# Patient Record
Sex: Male | Born: 1965 | Race: Black or African American | Hispanic: No | Marital: Married | Smoking: Never smoker
Health system: Southern US, Community
[De-identification: ages and names within clinical notes are randomized; demographics above are authoritative.]

## PROBLEM LIST (undated history)

## (undated) DIAGNOSIS — N200 Calculus of kidney: Secondary | ICD-10-CM

## (undated) DIAGNOSIS — R7303 Prediabetes: Secondary | ICD-10-CM

## (undated) HISTORY — PX: KNEE SURGERY: SHX244

## (undated) HISTORY — PX: APPENDECTOMY: SHX54

## (undated) HISTORY — PX: CHOLECYSTECTOMY: SHX55

---

## 2001-06-08 ENCOUNTER — Encounter: Admission: RE | Admit: 2001-06-08 | Discharge: 2001-06-08 | Payer: Self-pay | Admitting: Urology

## 2001-06-08 ENCOUNTER — Encounter: Payer: Self-pay | Admitting: Urology

## 2001-06-11 ENCOUNTER — Ambulatory Visit (HOSPITAL_BASED_OUTPATIENT_CLINIC_OR_DEPARTMENT_OTHER): Admission: RE | Admit: 2001-06-11 | Discharge: 2001-06-11 | Payer: Self-pay | Admitting: Urology

## 2007-08-30 ENCOUNTER — Emergency Department (HOSPITAL_COMMUNITY): Admission: EM | Admit: 2007-08-30 | Discharge: 2007-08-30 | Payer: Self-pay | Admitting: Family Medicine

## 2010-11-12 NOTE — Op Note (Signed)
[facility]  Patient:    Devon Knight, Devon Knight Visit Number: [id number] MRN: [id number]          Service Type: NES Location: [facility] Attending Physician:  Laqueta Jean Proc. Date: 06/11/01 Admit Date:  06/11/2001                             Operative Report  PREOPERATIVE DIAGNOSIS:  Left lower ureteral calculus.  POSTOPERATIVE DIAGNOSIS:  Left lower ureteral calculus.  PROCEDURE PERFORMED:  Cystourethroscopy, left retrograde pyelogram, balloon dilation of left lower ureter, ureteroscopy, basket extraction of left ureteral calculus, retrograde pyelogram with interpretation, and left JJ catheter.  PREPARATION:  After appropriate preanesthesia, the patient is brought to the operating room and placed on the operating table in the dorsosupine position where general LMA anesthesia was introduced.  He was then replaced in the low Allen stirrups, dorsolithotomy position where the pubis was prepped with Betadine solution and draped in the usual fashion.  DESCRIPTION OF PROCEDURE:  Cystoscopy revealed a normal appearing bladder.  A left retrograde pyelogram was performed which showed a very small left lower ureter, but no definite stone could be identified.  A  previously found calcification proved to be a phlebolith lateral to the left lower ureter.  The 6 French ureteroscope could not be passed into the ureter, therefore, a 4 cm balloon dilator was placed in the lower ureter and the balloon dilated for three minutes.  Following balloon dilation, ureteroscopy then was easy and the ureter was noted to be quite reactive above the level of the balloon dilation. Ureteroscopy was continued into the mid ureter and a left ureteral calculus was identified and basket extracted measuring approximately 3 mm, consistent with the CT scan observation of left lower ureteral 3 mm stone.  The stone was of some interest because it was relatively clear to light tan in nature  as opposed to black or dark brown.  Repeat ureteroscopy revealed no other stone, retrograde pyelogram revealed no other stone and a normal appearing upper ureter.  It was elected to leave the JJ because of the severe reactive thickness of the midureter and reactivity of the lower ureter.  A 6 French x 26 cc JJ catheter was passed over the wire into the kidney and left curved into the bladder. The patient tolerated the procedure quite well and was given IV Toradol and at the beginning of the case had B&O suppository.  He had Xylocaine in the ureter as well as Xylocaine in the urethral.  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  He was awakened and taken to the recovery room in good condition. Attending Physician:  Laqueta Jean DD:  06/11/01 TD:  06/12/01 Job: [id number]

## 2015-11-23 ENCOUNTER — Encounter (HOSPITAL_COMMUNITY): Payer: Self-pay | Admitting: Emergency Medicine

## 2015-11-23 DIAGNOSIS — N200 Calculus of kidney: Secondary | ICD-10-CM | POA: Diagnosis not present

## 2015-11-23 DIAGNOSIS — R109 Unspecified abdominal pain: Secondary | ICD-10-CM | POA: Diagnosis present

## 2015-11-23 DIAGNOSIS — Z9049 Acquired absence of other specified parts of digestive tract: Secondary | ICD-10-CM | POA: Insufficient documentation

## 2015-11-23 NOTE — ED Notes (Signed)
Pt. reports left flank pain radiating to left groin onset this evening , denies hematuria or dysuria . No fever or chills. Pt. stated history of kidney stones.

## 2015-11-24 ENCOUNTER — Encounter (HOSPITAL_COMMUNITY): Payer: Self-pay | Admitting: Emergency Medicine

## 2015-11-24 ENCOUNTER — Emergency Department (HOSPITAL_COMMUNITY): Payer: BC Managed Care – PPO

## 2015-11-24 ENCOUNTER — Emergency Department (HOSPITAL_COMMUNITY)
Admission: EM | Admit: 2015-11-24 | Discharge: 2015-11-24 | Disposition: A | Discharge status: Home / Self Care | Payer: BC Managed Care – PPO | Attending: Emergency Medicine | Admitting: Emergency Medicine

## 2015-11-24 DIAGNOSIS — R52 Pain, unspecified: Secondary | ICD-10-CM

## 2015-11-24 DIAGNOSIS — N200 Calculus of kidney: Secondary | ICD-10-CM

## 2015-11-24 HISTORY — DX: Calculus of kidney: N20.0

## 2015-11-24 LAB — COMPREHENSIVE METABOLIC PANEL
ALT: 29 U/L (ref 17–63)
ANION GAP: 4 — AB (ref 5–15)
AST: 31 U/L (ref 15–41)
Albumin: 3.9 g/dL (ref 3.5–5.0)
Alkaline Phosphatase: 47 U/L (ref 38–126)
BUN: 20 mg/dL (ref 6–20)
CHLORIDE: 105 mmol/L (ref 101–111)
CO2: 28 mmol/L (ref 22–32)
Calcium: 9 mg/dL (ref 8.9–10.3)
Creatinine, Ser: 1.7 mg/dL — ABNORMAL HIGH (ref 0.61–1.24)
GFR, EST AFRICAN AMERICAN: 52 mL/min — AB (ref >60)
GFR, EST NON AFRICAN AMERICAN: 45 mL/min — AB (ref >60)
Glucose, Bld: 158 mg/dL — ABNORMAL HIGH (ref 65–99)
POTASSIUM: 3.8 mmol/L (ref 3.5–5.1)
Sodium: 137 mmol/L (ref 135–145)
TOTAL PROTEIN: 6.7 g/dL (ref 6.5–8.1)
Total Bilirubin: 0.4 mg/dL (ref 0.3–1.2)

## 2015-11-24 LAB — URINE MICROSCOPIC-ADD ON

## 2015-11-24 LAB — URINALYSIS, ROUTINE W REFLEX MICROSCOPIC
Bilirubin Urine: NEGATIVE
Glucose, UA: NEGATIVE mg/dL
Ketones, ur: NEGATIVE mg/dL
NITRITE: NEGATIVE
PROTEIN: NEGATIVE mg/dL
SPECIFIC GRAVITY, URINE: 1.027 (ref 1.005–1.030)
pH: 6 (ref 5.0–8.0)

## 2015-11-24 LAB — CBC
HEMATOCRIT: 40.4 % (ref 39.0–52.0)
Hemoglobin: 13.4 g/dL (ref 13.0–17.0)
MCH: 30.4 pg (ref 26.0–34.0)
MCHC: 33.2 g/dL (ref 30.0–36.0)
MCV: 91.6 fL (ref 78.0–100.0)
Platelets: 227 10*3/uL (ref 150–400)
RBC: 4.41 MIL/uL (ref 4.22–5.81)
RDW: 12.8 % (ref 11.5–15.5)
WBC: 7.3 10*3/uL (ref 4.0–10.5)

## 2015-11-24 MED ORDER — TAMSULOSIN HCL 0.4 MG PO CAPS: 0.4000 mg | ORAL_CAPSULE | Freq: Every day | ORAL | Status: DC

## 2015-11-24 MED ORDER — ONDANSETRON 8 MG PO TBDP: ORAL_TABLET | ORAL | Status: DC

## 2015-11-24 MED ORDER — MORPHINE SULFATE (PF) 4 MG/ML IV SOLN
4.0000 mg | Freq: Once | INTRAVENOUS | Status: AC
  Administered 2015-11-24: 4 mg via INTRAVENOUS
  Filled 2015-11-24: qty 1

## 2015-11-24 MED ORDER — TAMSULOSIN HCL 0.4 MG PO CAPS
0.4000 mg | ORAL_CAPSULE | Freq: Every day | ORAL | Status: DC
  Administered 2015-11-24: 0.4 mg via ORAL
  Filled 2015-11-24: qty 1

## 2015-11-24 MED ORDER — OXYCODONE-ACETAMINOPHEN 5-325 MG PO TABS: 1.0000 | ORAL_TABLET | Freq: Four times a day (QID) | ORAL | Status: DC | PRN

## 2015-11-24 MED ORDER — ONDANSETRON HCL 4 MG/2ML IJ SOLN
4.0000 mg | Freq: Once | INTRAMUSCULAR | Status: AC
  Administered 2015-11-24: 4 mg via INTRAVENOUS
  Filled 2015-11-24: qty 2

## 2015-11-24 MED ORDER — SODIUM CHLORIDE 0.9 % IV BOLUS (SEPSIS)
1000.0000 mL | Freq: Once | INTRAVENOUS | Status: AC
Start: 2015-11-24 — End: 2015-11-24
  Administered 2015-11-24: 1000 mL via INTRAVENOUS

## 2015-11-24 MED ORDER — IBUPROFEN 800 MG PO TABS: 800.0000 mg | ORAL_TABLET | Freq: Three times a day (TID) | ORAL | Status: DC

## 2015-11-24 MED ORDER — KETOROLAC TROMETHAMINE 30 MG/ML IJ SOLN
30.0000 mg | Freq: Once | INTRAMUSCULAR | Status: AC
  Administered 2015-11-24: 30 mg via INTRAVENOUS
  Filled 2015-11-24: qty 1

## 2015-11-24 NOTE — ED Notes (Signed)
Dr. Palumbo at bedside. 

## 2015-11-24 NOTE — ED Notes (Signed)
Pt given a strainer to take home with him.

## 2015-11-24 NOTE — ED Notes (Signed)
Patient Alert and oriented X4. Stable and ambulatory. Patient verbalized understanding of the discharge instructions.  Patient belongings were taken by the patient.  

## 2015-11-24 NOTE — ED Provider Notes (Signed)
CSN: [id number]     Arrival date & time 11/23/15  2341 History  By signing my name below, I, Bridgette Habermann, attest that this documentation has been prepared under the direction and in the presence of Skylar Flynt, MD. Electronically Signed: Bridgette Habermann, ED Scribe. 11/24/2015. 12:55 AM.   Chief Complaint  Patient presents with  . Flank Pain   Patient is a 50 y.o. male presenting with flank pain. The history is provided by the patient. No language interpreter was used.  Flank Pain This is a new problem. The current episode started 3 to 5 hours ago. The problem occurs constantly. The problem has not changed since onset.Pertinent negatives include no chest pain, no abdominal pain, no headaches and no shortness of breath. Nothing aggravates the symptoms. Nothing relieves the symptoms. He has tried acetaminophen for the symptoms. The treatment provided no relief.   HPI Comments: Devon Knight is a 50 y.o. male with a h/o kidney stones who presents to the Emergency Department complaining of sudden onset, constant, left lower back pain radiating to his midthigh onset 8 pm today. He reports associated nausea and vomiting. Patient was at rest when the pain came on. Patient has tried Aleve to alleviate the pain with no relief. He notes he has not had this pain happen before. Patient denies dysuria, diarrhea, and constipation.   Past Medical History  Diagnosis Date  . Kidney stones    Past Surgical History  Procedure Laterality Date  . Knee surgery    . Cholecystectomy    . Appendectomy     No family history on file. Social History  Substance Use Topics  . Smoking status: Never Smoker   . Smokeless tobacco: None  . Alcohol Use: No    Review of Systems  Respiratory: Negative for shortness of breath.   Cardiovascular: Negative for chest pain.  Gastrointestinal: Positive for vomiting. Negative for abdominal pain, diarrhea and constipation.  Genitourinary: Positive for flank pain. Negative for dysuria  and hematuria.  Musculoskeletal: Positive for back pain (Left lower backpain).  Neurological: Negative for headaches.  All other systems reviewed and are negative.     Allergies  Review of patient's allergies indicates no known allergies.  Home Medications   Prior to Admission medications   Not on File   Triage vitals: BP 140/89 mmHg  Pulse 69  Temp(Src) 98.4 F (36.9 C) (Oral)  Ht 5\' 5"  (1.651 m)  Wt 188 lb 9 oz (85.531 kg)  BMI 31.38 kg/m2  SpO2 100% Physical Exam  Constitutional: He is oriented to person, place, and time. He appears well-developed and well-nourished.     HENT:  Head: Normocephalic and atraumatic.  Mouth/Throat: Oropharynx is clear and moist.  Moist mucus membanes  Eyes: Pupils are equal, round, and reactive to light.  Neck: Normal range of motion. Neck supple.  Cardiovascular: Normal rate and normal heart sounds.   No murmur heard. Intact DP pulses bilaterally  Pulmonary/Chest: Effort normal and breath sounds normal. He has no wheezes. He has no rales.  Lungs clear  Abdominal: Soft. There is no tenderness. There is no rebound and no guarding.  Hyperactive bowel sounds, no CVA tenderness  Musculoskeletal: Normal range of motion.  Paraspinal S1 tenderness  Neurological: He is alert and oriented to person, place, and time.  Skin: Skin is warm and dry.  Psychiatric: He has a normal mood and affect.  Nursing note and vitals reviewed.   ED Course  Procedures (including critical care time) DIAGNOSTIC STUDIES: Oxygen  Saturation is 100% on RA, normal by my interpretation.    COORDINATION OF CARE: 12:39 AM Discussed treatment plan with pt at bedside which includes a urinalysis and pt agreed to plan.   Labs Review Labs Reviewed  COMPREHENSIVE METABOLIC PANEL  URINALYSIS, ROUTINE W REFLEX MICROSCOPIC (NOT AT [facility])  CBC    Imaging Review No results found. I have personally reviewed and evaluated these images and lab results as part of my  medical decision-making.   EKG Interpretation None      MDM   Final diagnoses:  None   Filed Vitals:   11/23/15 2357  BP: 140/89  Pulse: 69  Temp: 98.4 F (36.9 C)   Results for orders placed or performed during the hospital encounter of 11/24/15  Comprehensive metabolic panel  Result Value Ref Range   Sodium 137 135 - 145 mmol/L   Potassium 3.8 3.5 - 5.1 mmol/L   Chloride 105 101 - 111 mmol/L   CO2 28 22 - 32 mmol/L   Glucose, Bld 158 (H) 65 - 99 mg/dL   BUN 20 6 - 20 mg/dL   Creatinine, Ser 1.611.70 (H) 0.61 - 1.24 mg/dL   Calcium 9.0 8.9 - 09.610.3 mg/dL   Total Protein 6.7 6.5 - 8.1 g/dL   Albumin 3.9 3.5 - 5.0 g/dL   AST 31 15 - 41 U/L   ALT 29 17 - 63 U/L   Alkaline Phosphatase 47 38 - 126 U/L   Total Bilirubin 0.4 0.3 - 1.2 mg/dL   GFR calc non Af Amer 45 (L) >60 mL/min   GFR calc Af Amer 52 (L) >60 mL/min   Anion gap 4 (L) 5 - 15  Urinalysis, Routine w reflex microscopic (not at [facility])  Result Value Ref Range   Color, Urine YELLOW YELLOW   APPearance CLOUDY (A) CLEAR   Specific Gravity, Urine 1.027 1.005 - 1.030   pH 6.0 5.0 - 8.0   Glucose, UA NEGATIVE NEGATIVE mg/dL   Hgb urine dipstick LARGE (A) NEGATIVE   Bilirubin Urine NEGATIVE NEGATIVE   Ketones, ur NEGATIVE NEGATIVE mg/dL   Protein, ur NEGATIVE NEGATIVE mg/dL   Nitrite NEGATIVE NEGATIVE   Leukocytes, UA SMALL (A) NEGATIVE  CBC  Result Value Ref Range   WBC 7.3 4.0 - 10.5 K/uL   RBC 4.41 4.22 - 5.81 MIL/uL   Hemoglobin 13.4 13.0 - 17.0 g/dL   HCT 04.540.4 40.939.0 - 81.152.0 %   MCV 91.6 78.0 - 100.0 fL   MCH 30.4 26.0 - 34.0 pg   MCHC 33.2 30.0 - 36.0 g/dL   RDW 91.412.8 78.211.5 - 95.615.5 %   Platelets 227 150 - 400 K/uL  Urine microscopic-add on  Result Value Ref Range   Squamous Epithelial / LPF 0-5 (A) NONE SEEN   WBC, UA 6-30 0 - 5 WBC/hpf   RBC / HPF TOO NUMEROUS TO COUNT 0 - 5 RBC/hpf   Bacteria, UA FEW (A) NONE SEEN   Urine-Other MUCOUS PRESENT    No results found.  Medications  sodium chloride 0.9 %  bolus 1,000 mL (not administered)  ketorolac (TORADOL) 30 MG/ML injection 30 mg (30 mg Intravenous Given 11/24/15 0117)  ondansetron (ZOFRAN) injection 4 mg (4 mg Intravenous Given 11/24/15 0117)  morphine 4 MG/ML injection 4 mg (4 mg Intravenous Given 11/24/15 0118)   Strain all urine.  Follow up with urology in 7 days.  Take zofran and then your pain medication.  Strict return precautions given    I personally  performed the services described in this documentation, which was scribed in my presence. The recorded information has been reviewed and is accurate.        Rooney Gladwin, MD 11/24/15 0230

## 2016-11-10 ENCOUNTER — Other Ambulatory Visit: Payer: Self-pay | Admitting: Family Medicine

## 2016-11-10 DIAGNOSIS — R0602 Shortness of breath: Secondary | ICD-10-CM

## 2016-11-11 ENCOUNTER — Ambulatory Visit (INDEPENDENT_AMBULATORY_CARE_PROVIDER_SITE_OTHER): Payer: BC Managed Care – PPO | Admitting: Internal Medicine

## 2016-11-11 DIAGNOSIS — R0602 Shortness of breath: Secondary | ICD-10-CM

## 2016-11-11 LAB — PULMONARY FUNCTION TEST
DL/VA % PRED: 115 %
DL/VA: 4.96 ml/min/mmHg/L
DLCO COR: 23.39 ml/min/mmHg
DLCO cor % pred: 91 %
DLCO unc % pred: 89 %
DLCO unc: 22.91 ml/min/mmHg
FEF 25-75 Post: 3.61 L/sec
FEF 25-75 Pre: 2.83 L/sec
FEF2575-%CHANGE-POST: 27 %
FEF2575-%PRED-PRE: 102 %
FEF2575-%Pred-Post: 131 %
FEV1-%Change-Post: 5 %
FEV1-%PRED-PRE: 88 %
FEV1-%Pred-Post: 92 %
FEV1-POST: 2.52 L
FEV1-Pre: 2.39 L
FEV1FVC-%CHANGE-POST: 3 %
FEV1FVC-%Pred-Pre: 106 %
FEV6-%Change-Post: 1 %
FEV6-%PRED-PRE: 84 %
FEV6-%Pred-Post: 86 %
FEV6-POST: 2.85 L
FEV6-Pre: 2.8 L
FEV6FVC-%Change-Post: 0 %
FEV6FVC-%PRED-POST: 104 %
FEV6FVC-%PRED-PRE: 104 %
FVC-%CHANGE-POST: 1 %
FVC-%PRED-POST: 83 %
FVC-%PRED-PRE: 81 %
FVC-POST: 2.85 L
FVC-PRE: 2.8 L
POST FEV6/FVC RATIO: 100 %
PRE FEV6/FVC RATIO: 100 %
Post FEV1/FVC ratio: 88 %
Pre FEV1/FVC ratio: 86 %
RV % pred: 93 %
RV: 1.72 L
TLC % PRED: 79 %
TLC: 4.76 L

## 2016-11-11 NOTE — Progress Notes (Signed)
PFT completed today. 11/11/16  

## 2018-01-10 IMAGING — CT CT RENAL STONE PROTOCOL
2 of 4 series · 11 of 46 positions shown, 12 images · non-contrast
Comparison: None.

CLINICAL DATA: Left flank pain radiating to the groin, onset this
evening.

EXAM:
CT ABDOMEN AND PELVIS WITHOUT CONTRAST
TECHNIQUE: Multidetector CT imaging of the abdomen and pelvis was performed
following the standard protocol without IV contrast.

[Series 201: stone study, idose (2) · axial · 0.76mm/px · z∈[+146,+541]mm · 8 of 97 slices shown, 9 images]
[im 9/97  soft-tissue]
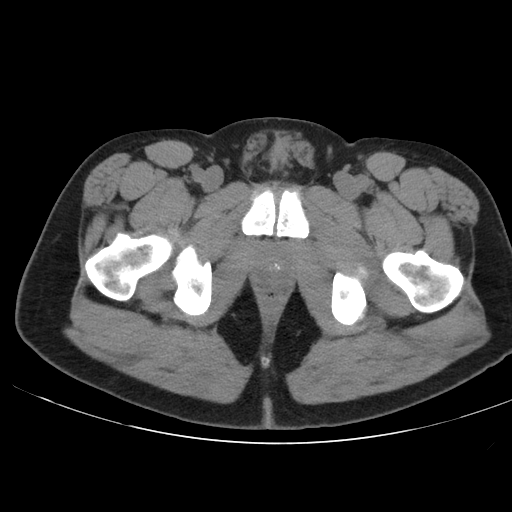
[im 9/97  bone]
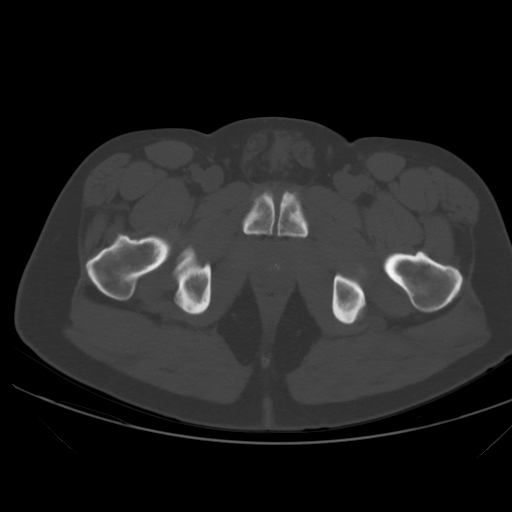
[im 21/97  soft-tissue]
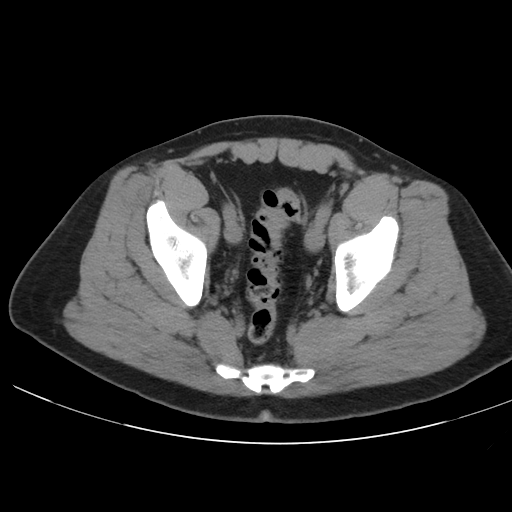
[im 30/97  soft-tissue]
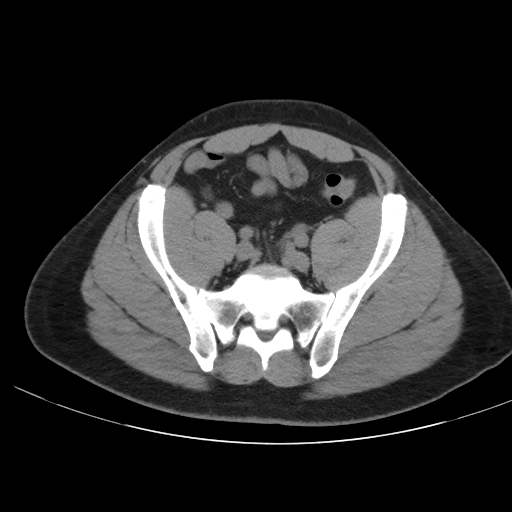
[im 42/97  soft-tissue]
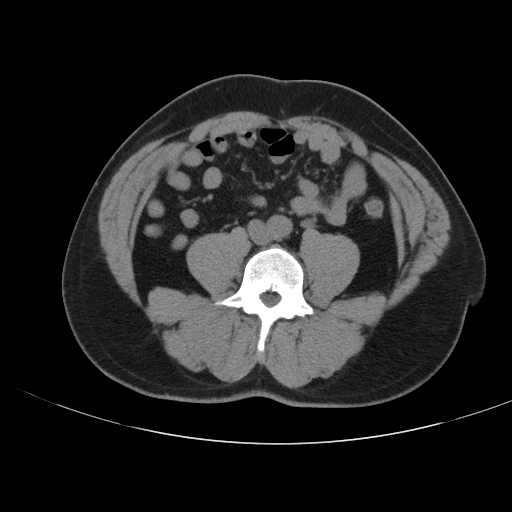
[im 55/97  soft-tissue]
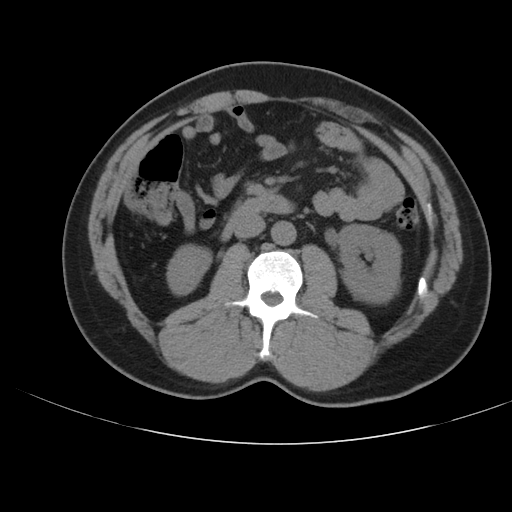
[im 67/97  soft-tissue]
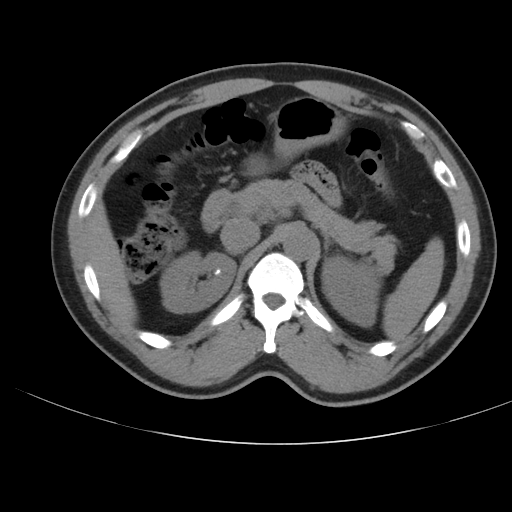
[im 76/97  soft-tissue]
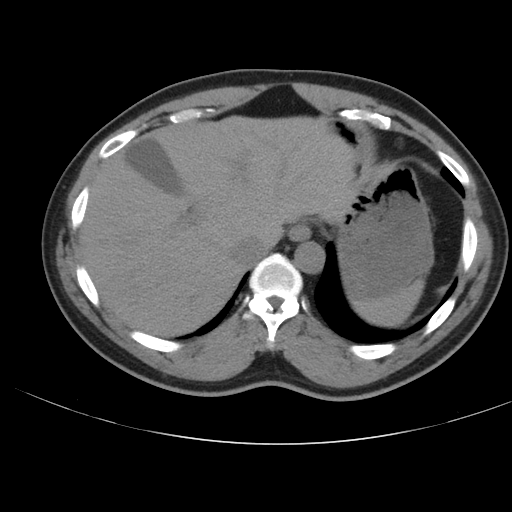
[im 88/97  soft-tissue]
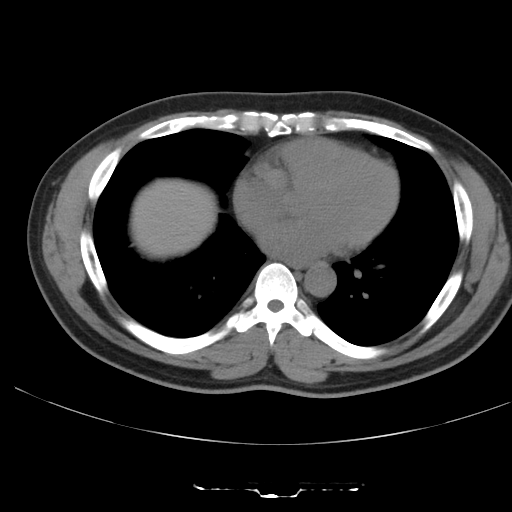

[Series 203: coronals, idose (2) · coronal · 0.45mm/px · 3 of 112 slices shown]
[im 38/112  soft-tissue]
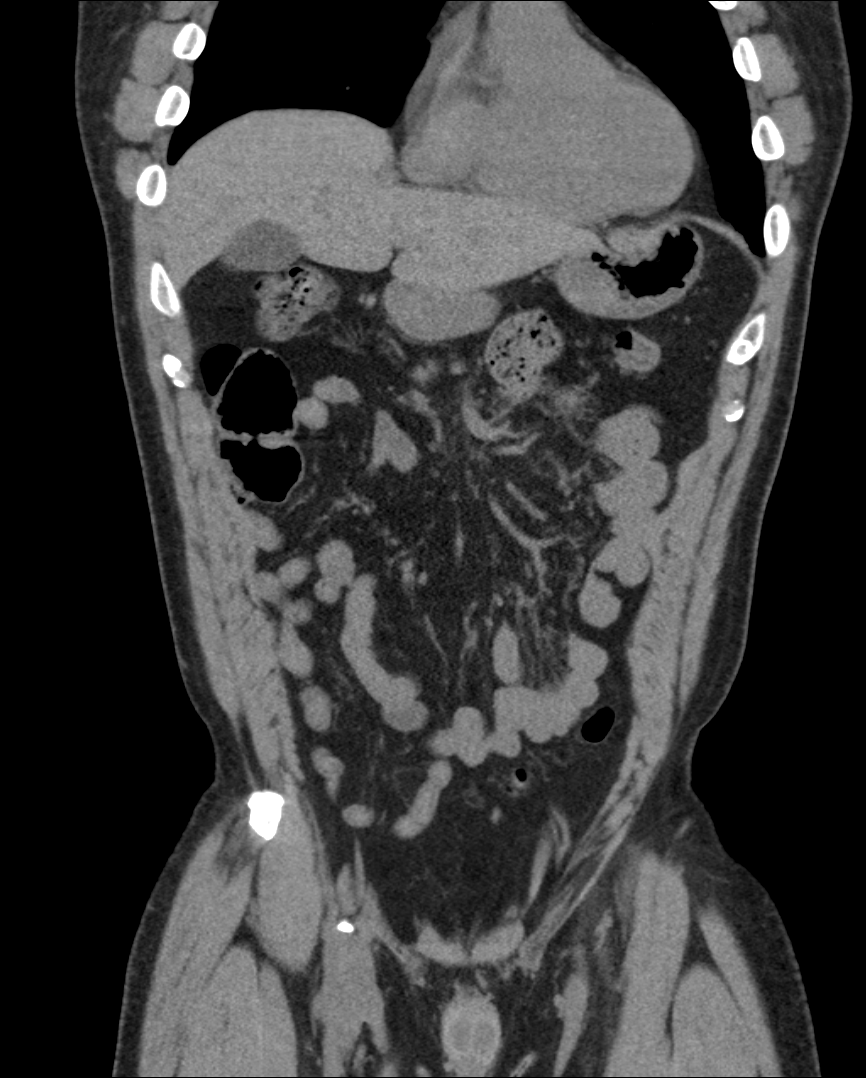
[im 50/112  soft-tissue]
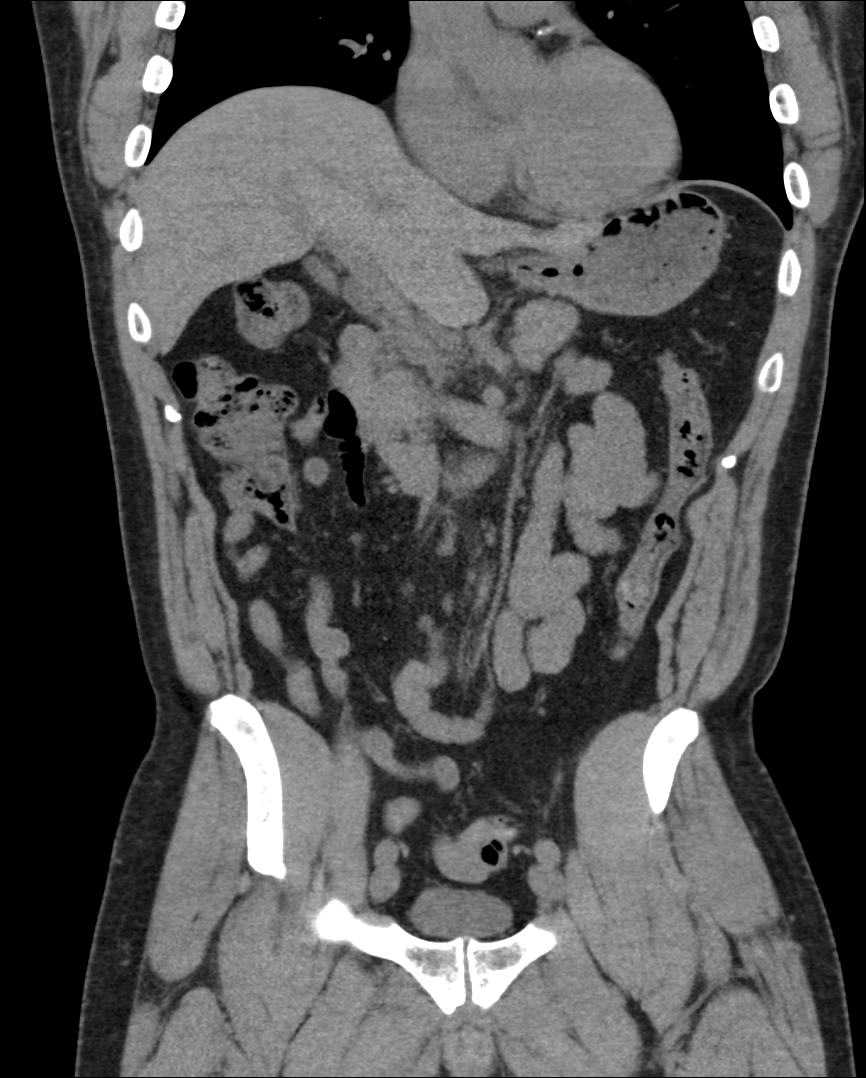
[im 62/112  soft-tissue]
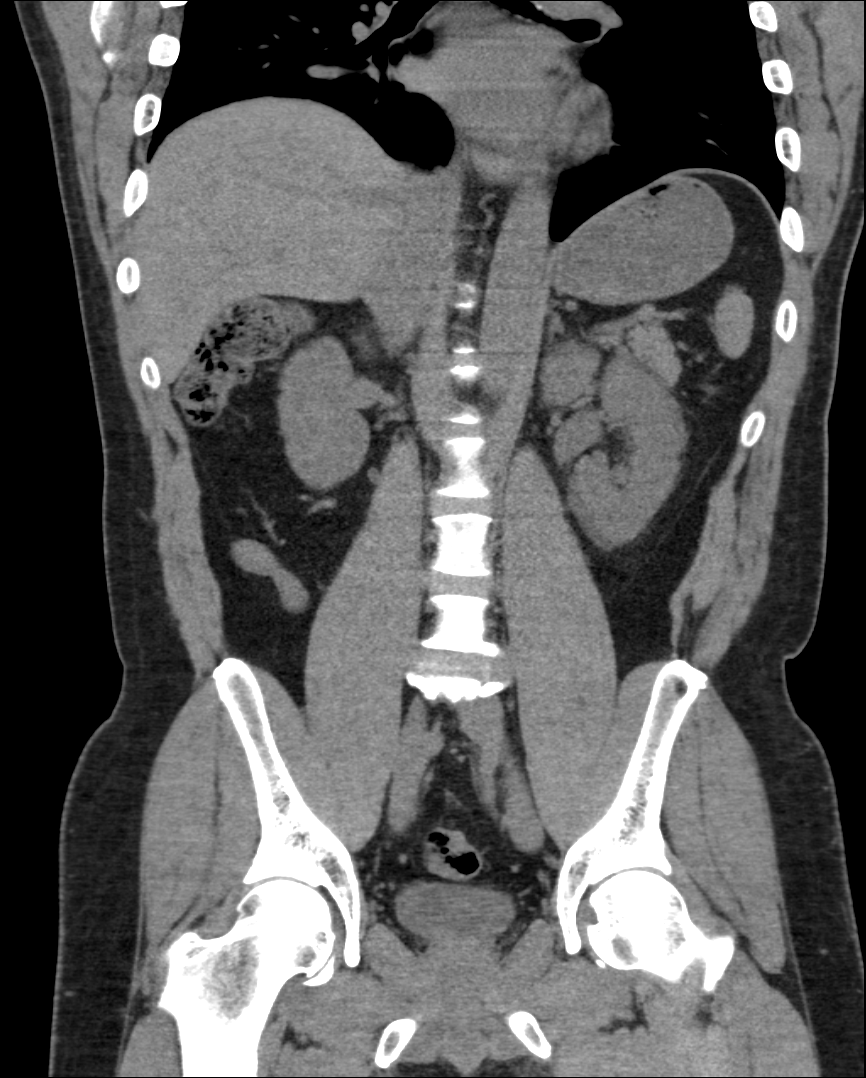

[11 of 46 positions shown; findings below may reference images not displayed]

FINDINGS: Lower chest: The included lung bases are clear. Coronary artery
calcifications are seen.

Liver: No focal lesion allowing for lack of contrast.

Hepatobiliary: Gallbladder physiologically distended, no calcified
stone. No biliary dilatation.

Pancreas: No ductal dilatation or inflammation.

Spleen: Normal.

Adrenal glands: No nodule.

Kidneys: Obstructing 4 x 5 mm stone in the left distal ureter with
moderate proximal hydroureteronephrosis. There is mild perinephric
edema. The 3mm nonobstructing stone is seen in the upper left
kidney. 4 mm nonobstructing stone in the upper right kidney, with
probable additional punctate interpolar stones. There is no right
hydronephrosis or perinephric stranding. The right ureter is
decompressed.

Stomach/Bowel: Stomach physiologically distended. There are no
dilated or thickened small bowel loops. Fecalization of small bowel
contents in the left abdomen likely reactive ileus. Small volume of
stool throughout the colon without colonic wall thickening. A few
scattered colonic diverticula in the distal colon without
diverticulitis. The appendix is not visualized, surgically absent
per report.

Vascular/Lymphatic: No retroperitoneal adenopathy. Abdominal aorta
is normal in caliber. Mild to moderate aorto bi-iliac
atherosclerosis without aneurysm.

Reproductive: Central prostatic calcifications.

Bladder: Minimally distended, no bladder stone.

Other: No free air, free fluid, or intra-abdominal fluid collection.

Musculoskeletal: There are no acute or suspicious osseous
abnormalities. Mild degenerative change in the spine.
IMPRESSION: 1. Obstructing 4x5 mm stone in the distal left ureter with moderate
hydronephrosis.
2. Additional bilateral nonobstructing nephrolithiasis.
3. Aorta bi-iliac atherosclerosis without aneurysm. Coronary artery
calcifications are incidentally noted.
4. Mild colonic diverticulosis without diverticulitis.

## 2018-12-31 ENCOUNTER — Emergency Department (HOSPITAL_COMMUNITY)
Admission: EM | Admit: 2018-12-31 | Discharge: 2018-12-31 | Disposition: A | Discharge status: Home / Self Care | Payer: BC Managed Care – PPO | Attending: Emergency Medicine | Admitting: Emergency Medicine

## 2018-12-31 ENCOUNTER — Emergency Department (HOSPITAL_COMMUNITY): Payer: BC Managed Care – PPO

## 2018-12-31 ENCOUNTER — Encounter (HOSPITAL_COMMUNITY): Payer: Self-pay | Admitting: Emergency Medicine

## 2018-12-31 ENCOUNTER — Other Ambulatory Visit: Payer: Self-pay

## 2018-12-31 DIAGNOSIS — R1010 Upper abdominal pain, unspecified: Secondary | ICD-10-CM

## 2018-12-31 DIAGNOSIS — R1013 Epigastric pain: Secondary | ICD-10-CM | POA: Diagnosis present

## 2018-12-31 DIAGNOSIS — K2900 Acute gastritis without bleeding: Secondary | ICD-10-CM | POA: Insufficient documentation

## 2018-12-31 DIAGNOSIS — Z7984 Long term (current) use of oral hypoglycemic drugs: Secondary | ICD-10-CM | POA: Insufficient documentation

## 2018-12-31 DIAGNOSIS — R7989 Other specified abnormal findings of blood chemistry: Secondary | ICD-10-CM

## 2018-12-31 DIAGNOSIS — R945 Abnormal results of liver function studies: Secondary | ICD-10-CM | POA: Diagnosis not present

## 2018-12-31 DIAGNOSIS — R1011 Right upper quadrant pain: Secondary | ICD-10-CM

## 2018-12-31 HISTORY — DX: Prediabetes: R73.03

## 2018-12-31 LAB — COMPREHENSIVE METABOLIC PANEL
ALT: 370 U/L — ABNORMAL HIGH (ref 0–44)
AST: 183 U/L — ABNORMAL HIGH (ref 15–41)
Albumin: 3.6 g/dL (ref 3.5–5.0)
Alkaline Phosphatase: 189 U/L — ABNORMAL HIGH (ref 38–126)
Anion gap: 11 (ref 5–15)
BUN: 21 mg/dL — ABNORMAL HIGH (ref 6–20)
CO2: 24 mmol/L (ref 22–32)
Calcium: 9.2 mg/dL (ref 8.9–10.3)
Chloride: 105 mmol/L (ref 98–111)
Creatinine, Ser: 1.13 mg/dL (ref 0.61–1.24)
GFR calc Af Amer: >60 mL/min (ref >60)
GFR calc non Af Amer: >60 mL/min (ref >60)
Glucose, Bld: 156 mg/dL — ABNORMAL HIGH (ref 70–99)
Potassium: 4 mmol/L (ref 3.5–5.1)
Sodium: 140 mmol/L (ref 135–145)
Total Bilirubin: 0.9 mg/dL (ref 0.3–1.2)
Total Protein: 6.7 g/dL (ref 6.5–8.1)

## 2018-12-31 LAB — CBC
HCT: 41.5 % (ref 39.0–52.0)
Hemoglobin: 13.7 g/dL (ref 13.0–17.0)
MCH: 30.6 pg (ref 26.0–34.0)
MCHC: 33 g/dL (ref 30.0–36.0)
MCV: 92.8 fL (ref 80.0–100.0)
Platelets: 234 10*3/uL (ref 150–400)
RBC: 4.47 MIL/uL (ref 4.22–5.81)
RDW: 12.8 % (ref 11.5–15.5)
WBC: 3.4 10*3/uL — ABNORMAL LOW (ref 4.0–10.5)
nRBC: 0 % (ref 0.0–0.2)

## 2018-12-31 LAB — URINALYSIS, ROUTINE W REFLEX MICROSCOPIC
Bilirubin Urine: NEGATIVE
Glucose, UA: NEGATIVE mg/dL
Ketones, ur: NEGATIVE mg/dL
Nitrite: NEGATIVE
Protein, ur: NEGATIVE mg/dL
Specific Gravity, Urine: 1.02 (ref 1.005–1.030)
pH: 6 (ref 5.0–8.0)

## 2018-12-31 LAB — LIPASE, BLOOD: Lipase: 46 U/L (ref 11–51)

## 2018-12-31 MED ORDER — PANTOPRAZOLE SODIUM 40 MG PO TBEC: 40.0000 mg | DELAYED_RELEASE_TABLET | Freq: Every day | ORAL | 0 refills | Status: AC

## 2018-12-31 MED ORDER — LIDOCAINE VISCOUS HCL 2 % MT SOLN
15.0000 mL | Freq: Once | OROMUCOSAL | Status: AC
  Administered 2018-12-31: 15 mL via ORAL
  Filled 2018-12-31: qty 15

## 2018-12-31 MED ORDER — ALUM & MAG HYDROXIDE-SIMETH 200-200-20 MG/5ML PO SUSP
30.0000 mL | Freq: Once | ORAL | Status: AC
  Administered 2018-12-31: 30 mL via ORAL
  Filled 2018-12-31: qty 30

## 2018-12-31 MED ORDER — FAMOTIDINE 20 MG PO TABS: 20.0000 mg | ORAL_TABLET | Freq: Two times a day (BID) | ORAL | 0 refills | Status: AC

## 2018-12-31 MED ORDER — IOHEXOL 300 MG/ML  SOLN
100.0000 mL | Freq: Once | INTRAMUSCULAR | Status: AC | PRN
  Administered 2018-12-31: 08:00:00 100 mL via INTRAVENOUS

## 2018-12-31 NOTE — Discharge Instructions (Signed)
If you develop worsening, continued, or recurrent abdominal pain, uncontrolled vomiting, fever, chest or back pain, or any other new/concerning symptoms then return to the ER for evaluation.   Your lab work shows your liver tests are abnormal today.  Do not take medicines that could affect the liver such as Tylenol or alcohol.  Follow-up with your primary care physician for further outpatient testing of your liver.

## 2018-12-31 NOTE — ED Triage Notes (Signed)
Patient reports persistent mid/upper abdominal pain for several days with emesis this morning , denies fever or diarrhea . Seen at an urgent care Saturday for the same complaints advised to go to ER if symptoms persists.

## 2018-12-31 NOTE — ED Provider Notes (Signed)
[facility] EMERGENCY DEPARTMENT Provider Note   CSN: [id number] Arrival date & time: 12/31/18  0450    History   Chief Complaint Chief Complaint  Patient presents with  . Abdominal Pain    HPI Devon Knight is a 53 y.o. male.     HPI  53 year old male presents with upper abdominal discomfort.  Is been going on for about a week.  It feels like a tightness in his upper abdomen.  Is pretty much constant.  Food usually does not seem to make it worse.  Seem to get a little bit worse when he went to bed last night.  He vomited once in the middle the night.  No blood.  No diarrhea or constipation.  He had gone to urgent care earlier and had an EKG done and was told he needed an ultrasound.  He has tried an antacid once with no relief.  He is been trying Aleve.  No chest pain or shortness of breath.  The pain does not radiate. Currently about a 6/10.  Past Medical History:  Diagnosis Date  . Kidney stones   . Pre-diabetes     There are no active problems to display for this patient.   Past Surgical History:  Procedure Laterality Date  . APPENDECTOMY    . CHOLECYSTECTOMY    . KNEE SURGERY          Home Medications    Prior to Admission medications   Medication Sig Start Date End Date Taking? Authorizing Provider  albuterol (VENTOLIN HFA) 108 (90 Base) MCG/ACT inhaler Inhale 2 puffs into the lungs every 6 (six) hours as needed for shortness of breath. 11/15/18  Yes [provider]  metFORMIN (GLUCOPHAGE-XR) 500 MG 24 hr tablet Take 500 mg by mouth daily. 11/29/18  Yes [provider]  naproxen sodium (ALEVE) 220 MG tablet Take 220 mg by mouth 2 (two) times daily as needed (pain).   Yes [provider]  QVAR REDIHALER 80 MCG/ACT inhaler Inhale 2 puffs into the lungs 2 (two) times daily as needed for shortness of breath. 11/15/18  Yes [provider]  famotidine (PEPCID) 20 MG tablet Take 1 tablet (20 mg total) by mouth 2 (two)  times daily. 12/31/18   Pricilla LovelessGoldston, Umberto Pavek, MD  ibuprofen (ADVIL,MOTRIN) 800 MG tablet Take 1 tablet (800 mg total) by mouth 3 (three) times daily. Patient not taking: Reported on 12/31/2018 11/24/15   Palumbo, April, MD  ondansetron [facility](ZOFRAN ODT) 8 MG disintegrating tablet 8mg  ODT q8 hours prn nausea Patient not taking: Reported on 12/31/2018 11/24/15   Palumbo, April, MD  oxyCODONE-acetaminophen (PERCOCET) 5-325 MG tablet Take 1 tablet by mouth every 6 (six) hours as needed. Patient not taking: Reported on 12/31/2018 11/24/15   Palumbo, April, MD  pantoprazole (PROTONIX) 40 MG tablet Take 1 tablet (40 mg total) by mouth daily. 12/31/18   Pricilla LovelessGoldston, Maximilien Hayashi, MD  tamsulosin (FLOMAX) 0.4 MG CAPS capsule Take 1 capsule (0.4 mg total) by mouth daily. Patient not taking: Reported on 12/31/2018 11/24/15   Nicanor AlconPalumbo, April, MD    Family History No family history on file.  Social History Social History   Tobacco Use  . Smoking status: Never Smoker  . Smokeless tobacco: Never Used  Substance Use Topics  . Alcohol use: No  . Drug use: No     Allergies   Iodine   Review of Systems Review of Systems  Constitutional: Negative for fever.  Respiratory: Negative for shortness of breath.  Cardiovascular: Negative for chest pain.  Gastrointestinal: Positive for abdominal pain and vomiting. Negative for constipation and diarrhea.  Genitourinary: Negative for dysuria.  All other systems reviewed and are negative.    Physical Exam Updated Vital Signs BP (!) 134/92   Pulse (!) 57   Temp (!) 97.5 F (36.4 C) (Oral)   Resp 15   Ht 5\' 5"  (1.651 m)   Wt 85.3 kg   SpO2 98%   BMI 31.28 kg/m   Physical Exam Vitals signs and nursing note reviewed.  Constitutional:      General: He is not in acute distress.    Appearance: He is well-developed. He is not ill-appearing or diaphoretic.  HENT:     Head: Normocephalic and atraumatic.     Right Ear: External ear normal.     Left Ear: External ear normal.      Nose: Nose normal.  Eyes:     General:        Right eye: No discharge.        Left eye: No discharge.  Neck:     Musculoskeletal: Neck supple.  Cardiovascular:     Rate and Rhythm: Normal rate and regular rhythm.     Heart sounds: Normal heart sounds.  Pulmonary:     Effort: Pulmonary effort is normal.     Breath sounds: Normal breath sounds.  Abdominal:     Palpations: Abdomen is soft.     Tenderness: There is abdominal tenderness (minimal tenderness) in the epigastric area.  Skin:    General: Skin is warm and dry.  Neurological:     Mental Status: He is alert.  Psychiatric:        Mood and Affect: Mood is not anxious.      ED Treatments / Results  Labs (all labs ordered are listed, but only abnormal results are displayed) Labs Reviewed  COMPREHENSIVE METABOLIC PANEL - Abnormal; Notable for the following components:      Result Value   Glucose, Bld 156 (*)    BUN 21 (*)    AST 183 (*)    ALT 370 (*)    Alkaline Phosphatase 189 (*)    All other components within normal limits  CBC - Abnormal; Notable for the following components:   WBC 3.4 (*)    All other components within normal limits  URINALYSIS, ROUTINE W REFLEX MICROSCOPIC - Abnormal; Notable for the following components:   Hgb urine dipstick SMALL (*)    Leukocytes,Ua SMALL (*)    Bacteria, UA RARE (*)    All other components within normal limits  LIPASE, BLOOD    EKG EKG Interpretation  Date/Time:  Monday December 31 2018 07:25:33 EDT Ventricular Rate:  51 PR Interval:    QRS Duration: 89 QT Interval:  432 QTC Calculation: 398 R Axis:   -22 Text Interpretation:  Sinus rhythm Borderline left axis deviation Low voltage, precordial leads Abnormal R-wave progression, early transition no acute ST/T changes No old tracing to compare Confirmed by Pricilla LovelessGoldston, Mathilde Mcwherter [phone]([id number]) on 12/31/2018 7:59:53 AM   Radiology Ct Abdomen Pelvis W Contrast  Result Date: 12/31/2018 CLINICAL DATA:  Patient reports persistent  mid/upper abdominal pain for several days with emesis this morning , denies fever or diarrhea EXAM: CT ABDOMEN AND PELVIS WITH CONTRAST TECHNIQUE: Multidetector CT imaging of the abdomen and pelvis was performed using the standard protocol following bolus administration of intravenous contrast. CONTRAST:  100mL OMNIPAQUE IOHEXOL 300 MG/ML  SOLN COMPARISON:  Ultrasound of the abdomen  on 12/31/2018, CT of the abdomen and pelvis on 11/24/2015 FINDINGS: Lower chest: Lung bases are unremarkable. Hepatobiliary: No focal liver abnormality is seen. No radiopaque gallstones, biliary dilatation, or pericholecystic inflammatory changes. Pancreas: Unremarkable. No pancreatic ductal dilatation or surrounding inflammatory changes. Spleen: Normal in size without focal abnormality. Adrenals/Urinary Tract: Adrenal glands are unremarkable. No hydronephrosis or renal mass. The urinary bladder is unremarkable. Stomach/Bowel: The stomach and small bowel loops are normal in appearance. The appendix is surgically absent. There scattered colonic diverticula, particularly within the sigmoid segment. No associated inflammatory changes. No abscess or free intraperitoneal air. Vascular/Lymphatic: There is atherosclerotic calcification of the abdominal aorta, not associated with aneurysm. No retroperitoneal or mesenteric adenopathy. Reproductive: Prostatic calcifications are present. Other: No abdominal wall hernia or abnormality. No abdominopelvic ascites. Musculoskeletal: Mild degenerative changes in the LOWER lumbar spine. Insert osseous IMPRESSION: 1. Colonic diverticulosis without acute diverticulitis. 2. Status post appendectomy. 3. No bowel obstruction or abscess. 4. No urinary tract obstruction. Electronically Signed   By: Nolon Nations M.D.   On: 12/31/2018 08:32   US Abdomen Limited Ruq  Result Date: 12/31/2018 CLINICAL DATA:  Right upper quadrant pain.  Emesis. EXAM: ULTRASOUND ABDOMEN LIMITED RIGHT UPPER QUADRANT COMPARISON:   KUB 12/15/2015.  CT 11/24/2015. FINDINGS: Gallbladder: Gallbladder is contracted. No gallstones noted. Gallbladder wall slightly thickened at 3.8 mm. Although cholecystitis cannot be excluded mild gallbladder wall prominence may be secondary to contracted state of gallbladder. No pericholecystic fluid collection. Negative Murphy sign. Common bile duct: Diameter: 3.6 mm Liver: No focal lesion identified. Within normal limits in parenchymal echogenicity. Portal vein is patent on color Doppler imaging with normal direction of blood flow towards the liver. IMPRESSION: Gallbladder is contracted. No gallstones noted. Gallbladder wall slightly thickened at 3.8 mm. Although cholecystitis cannot be completely excluded, mild gallbladder wall prominence may be secondary to contracted state of gallbladder. No pericholecystic fluid collection. Negative Murphy sign. No biliary distention. Electronically Signed   By: Marcello Moores  Register   On: 12/31/2018 06:26    Procedures Procedures (including critical care time)  Medications Ordered in ED Medications  alum & mag hydroxide-simeth (MAALOX/MYLANTA) 200-200-20 MG/5ML suspension 30 mL (30 mLs Oral Given 12/31/18 0726)    And  lidocaine (XYLOCAINE) 2 % viscous mouth solution 15 mL (15 mLs Oral Given 12/31/18 0727)  iohexol (OMNIPAQUE) 300 MG/ML solution 100 mL (100 mLs Intravenous Contrast Given 12/31/18 0805)     Initial Impression / Assessment and Plan / ED Course  I have reviewed the triage vital signs and the nursing notes.  Pertinent labs & imaging results that were available during my care of the patient were reviewed by me and considered in my medical decision making (see chart for details).        Patient's abdominal pain did improve after GI cocktail.  He has some abnormal LFTs.  He denies any EtOH use.  Lipase and bilirubin are normal however.  Right upper quadrant ultrasound is benign.  Given the episode of vomiting with this abdominal pain and his prior  appendectomy, CT obtained to help rule out other intra-abdominal emergency such as obstruction.  This is benign.  I discussed he will need to follow-up with his PCP for recheck of the LFT abnormalities.  Avoid Tylenol and alcohol for now.  We discussed return precautions and will treat as gastritis for now.  Final Clinical Impressions(s) / ED Diagnoses   Final diagnoses:  RUQ pain  Upper abdominal pain  Acute gastritis without hemorrhage, unspecified gastritis type  Abnormal LFTs    ED Discharge Orders         Ordered    pantoprazole (PROTONIX) 40 MG tablet  Daily     12/31/18 0849    famotidine (PEPCID) 20 MG tablet  2 times daily     12/31/18 0849           Pricilla LovelessGoldston, Hutson Luft, MD 12/31/18 [phone]

## 2019-06-27 ENCOUNTER — Ambulatory Visit: Payer: BC Managed Care – PPO | Attending: Internal Medicine

## 2019-06-27 DIAGNOSIS — Z20822 Contact with and (suspected) exposure to covid-19: Secondary | ICD-10-CM

## 2019-06-29 LAB — NOVEL CORONAVIRUS, NAA: SARS-CoV-2, NAA: DETECTED — AB

## 2019-07-05 ENCOUNTER — Other Ambulatory Visit: Payer: BC Managed Care – PPO

## 2019-07-05 ENCOUNTER — Ambulatory Visit: Payer: BC Managed Care – PPO | Attending: Internal Medicine

## 2019-08-16 ENCOUNTER — Ambulatory Visit: Payer: BC Managed Care – PPO | Attending: Internal Medicine

## 2019-08-16 ENCOUNTER — Telehealth: Payer: Self-pay

## 2019-08-16 ENCOUNTER — Ambulatory Visit: Payer: BC Managed Care – PPO

## 2019-08-16 DIAGNOSIS — Z23 Encounter for immunization: Secondary | ICD-10-CM | POA: Insufficient documentation

## 2019-08-16 NOTE — Telephone Encounter (Signed)
Pt called with questions regarding his covid test and vaccine schedule. He was tested positive on 06/29/19, retested on 1/5 and got a neg results. He was tested on 2/1 with negative results. He wants to know if he can still get his vaccine on today at 5:15. Will send a message to nurse triage to call him back.   Joycelyn Rua Hopkins

## 2019-08-16 NOTE — Telephone Encounter (Signed)
Patient called stating that he was tested positive fpr COVID 19 06-29-19. He has had exposure at work. On 1-5/21 he tested negative to COVID-19. He has since had another exposure through work and tested negative 07-29-19. He is scheduled for his 1st vaccine today. He called to see if it was safe for him to receive. He has completed 14 days of quarantine since last exposure and has had no symptoms.  I has been 45 days since his 1st negative result. He was informed that it is OK for him to have his vaccine today. He verbalized understanding.

## 2019-08-16 NOTE — Telephone Encounter (Signed)
Attempted to contact patient. He has COVID-19 vaccine question. Left VM to return call to [phone]. 

## 2019-08-16 NOTE — Telephone Encounter (Signed)
Patient called stating that he was tested positive fpr COVID 19 06-29-19. He has had exposure at work. On 1-5/21 he tested negative to COVID-19. He has since had another exposure through work and tested negative 07-29-19. He is scheduled for his 1st vaccine today. He called to see if it was safe for him to receive. He has completed 14 days of quarantine since last exposure and has had no symptoms.  I has been 45 days since his 1st negative result. He was informed that it is OK for him to have his vaccine today. He verbalized understanding. 

## 2019-08-16 NOTE — Telephone Encounter (Signed)
Attempted to contact patient. He has COVID-19 vaccine question. Left VM to return call to [phone].

## 2019-08-16 NOTE — Progress Notes (Signed)
   Covid-19 Vaccination Clinic  Name:  Devon Knight    MRN: [id number] DOB: 01/18/1966  08/16/2019  Devon Knight was observed post Covid-19 immunization for 15 minutes without incidence. He was provided with Vaccine Information Sheet and instruction to access the V-Safe system.   Devon Knight was instructed to call 911 with any severe reactions post vaccine: Marland Kitchen Difficulty breathing  . Swelling of your face and throat  . A fast heartbeat  . A bad rash all over your body  . Dizziness and weakness    Immunizations Administered    Name Date Dose VIS Date Route   Pfizer COVID-19 Vaccine 08/16/2019  5:18 PM 0.3 mL 06/07/2019 Intramuscular   Manufacturer: [employer], [employer]   Lot: [id number]   NDC: [address]-1

## 2019-09-10 ENCOUNTER — Ambulatory Visit: Payer: BC Managed Care – PPO | Attending: Internal Medicine

## 2019-09-10 DIAGNOSIS — Z23 Encounter for immunization: Secondary | ICD-10-CM

## 2019-09-10 NOTE — Progress Notes (Signed)
   Covid-19 Vaccination Clinic  Name:  Devon Knight    MRN: [id number] DOB: [birth year]  09/10/2019  Mr. Devon Knight was observed post Covid-19 immunization for 15 minutes without incident. He was provided with Vaccine Information Sheet and instruction to access the V-Safe system.   Mr. Devon Knight was instructed to call 911 with any severe reactions post vaccine: Marland Kitchen Difficulty breathing  . Swelling of face and throat  . A fast heartbeat  . A bad rash all over body  . Dizziness and weakness   Immunizations Administered    Name Date Dose VIS Date Route   Pfizer COVID-19 Vaccine 09/10/2019  8:56 AM 0.3 mL 06/07/2019 Intramuscular   Manufacturer: [employer], [employer]   Lot: [id number]   NDC: [address]-5

## 2020-04-25 ENCOUNTER — Ambulatory Visit: Payer: BC Managed Care – PPO | Attending: Internal Medicine

## 2020-04-25 DIAGNOSIS — Z23 Encounter for immunization: Secondary | ICD-10-CM

## 2020-04-25 NOTE — Progress Notes (Signed)
   Covid-19 Vaccination Clinic  Name:  Devon Knight    MRN: [id number] DOB: [birth year]  04/25/2020  Mr. Devon Knight was observed post Covid-19 immunization for 15 minutes without incident. He was provided with Vaccine Information Sheet and instruction to access the V-Safe system.   Mr. Devon Knight was instructed to call 911 with any severe reactions post vaccine: Marland Kitchen Difficulty breathing  . Swelling of face and throat  . A fast heartbeat  . A bad rash all over body  . Dizziness and weakness

## 2021-02-16 IMAGING — US ULTRASOUND ABDOMEN LIMITED
1 series · 14 of 25 positions shown · non-contrast
Comparison: KUB 12/15/2015.  CT 11/24/2015.

CLINICAL DATA: Right upper quadrant pain.  Emesis.

EXAM:
ULTRASOUND ABDOMEN LIMITED RIGHT UPPER QUADRANT

[Series 1: ultrasound abdomen limited · 14 of 36 slices shown]
[im 1/36]
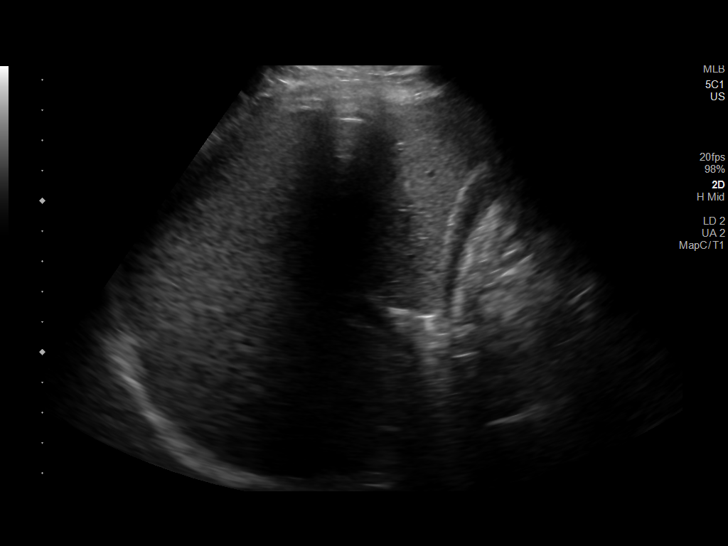
[im 3/36]
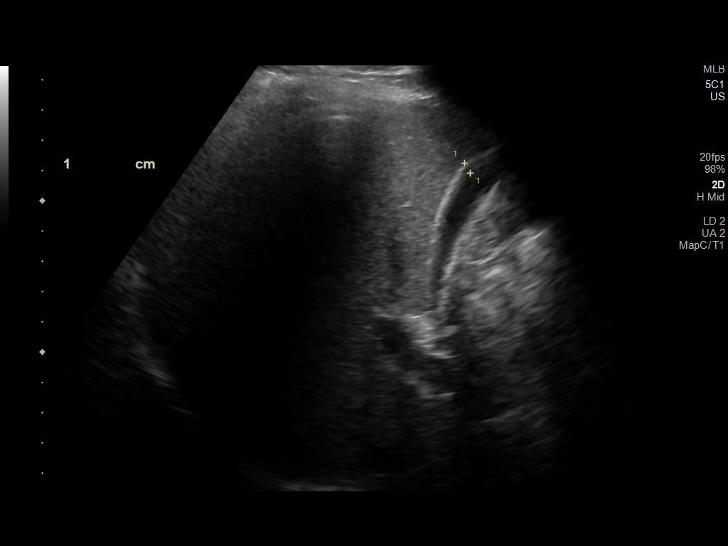
[im 6/36]
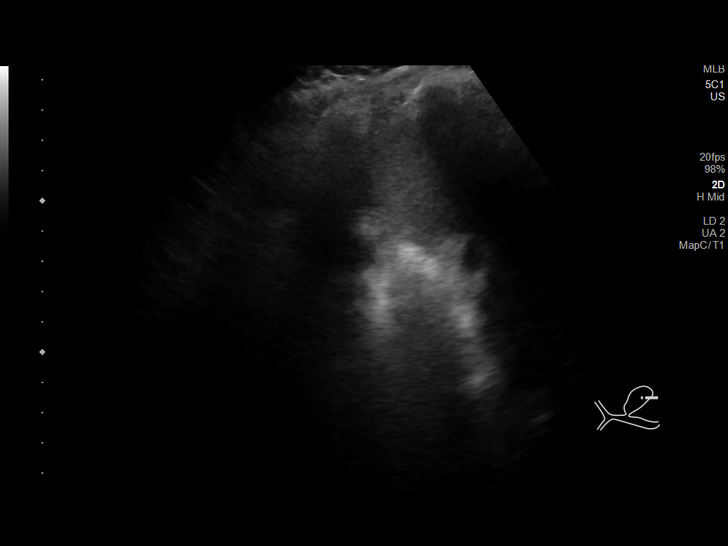
[im 9/36]
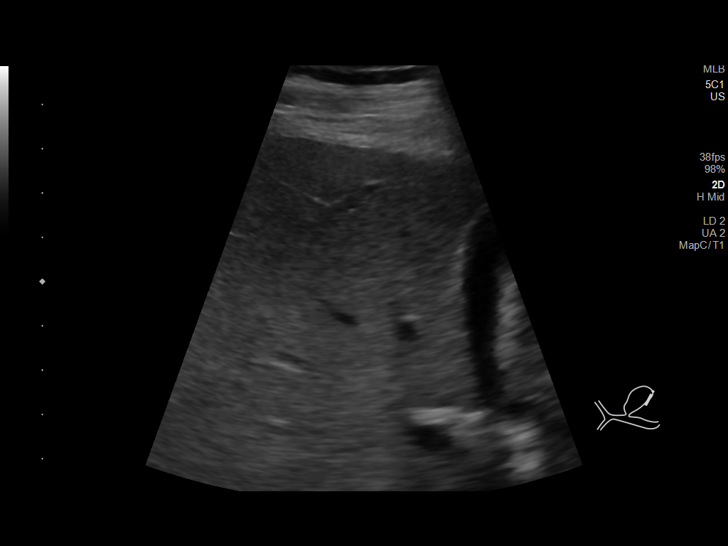
[im 12/36]
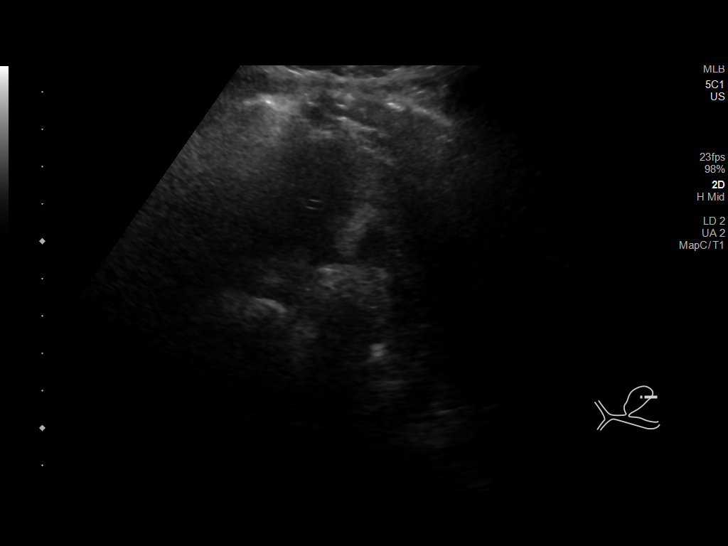
[im 14/36]
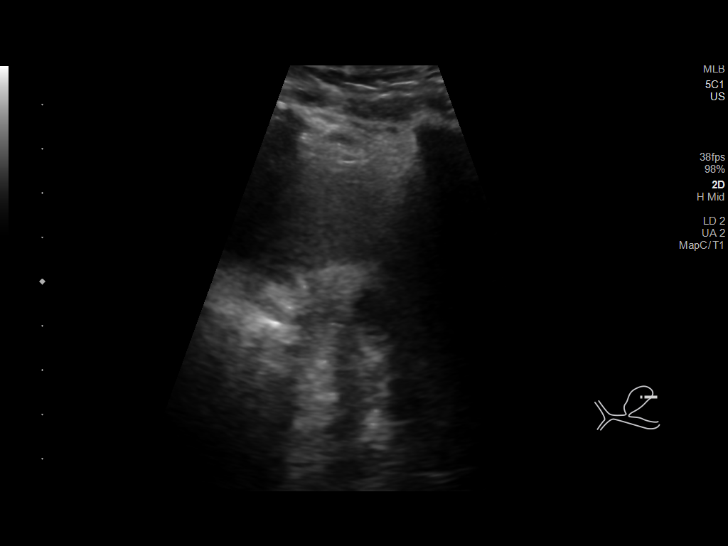
[im 17/36]
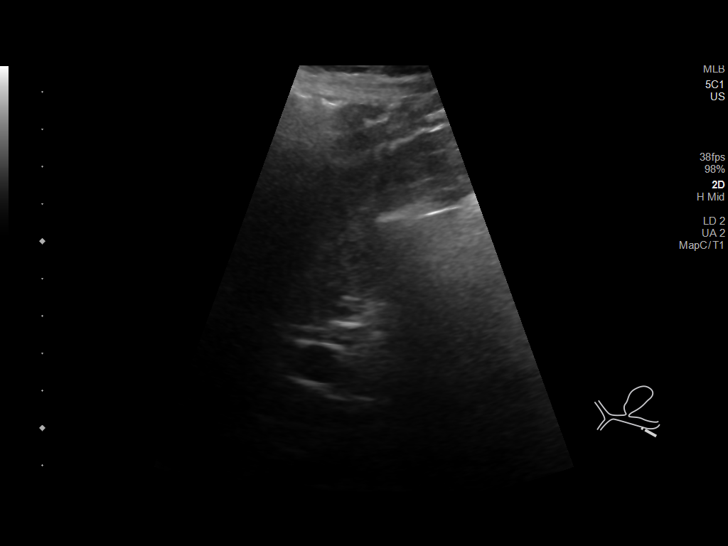
[im 19/36]
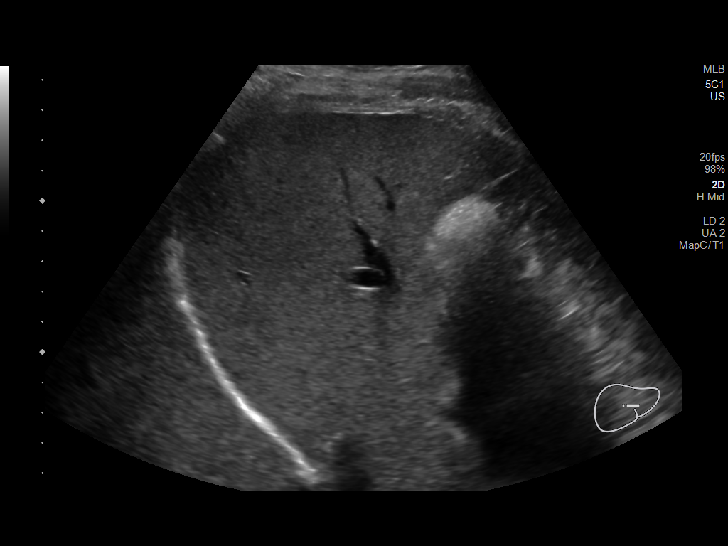
[im 22/36]
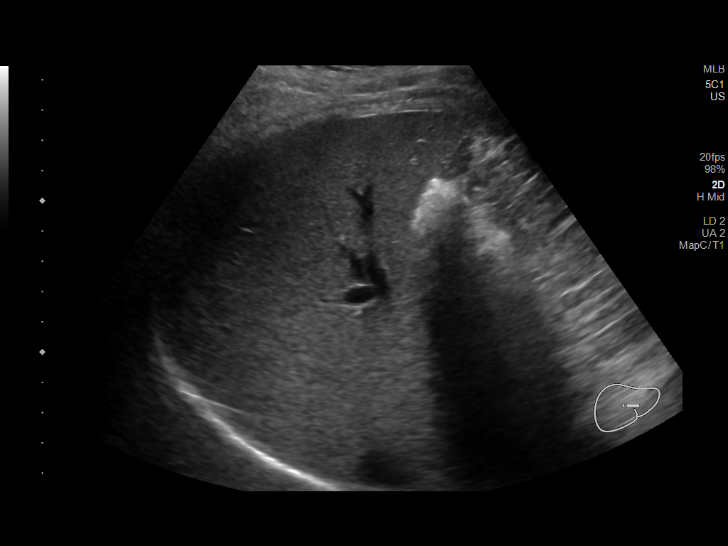
[im 24/36]
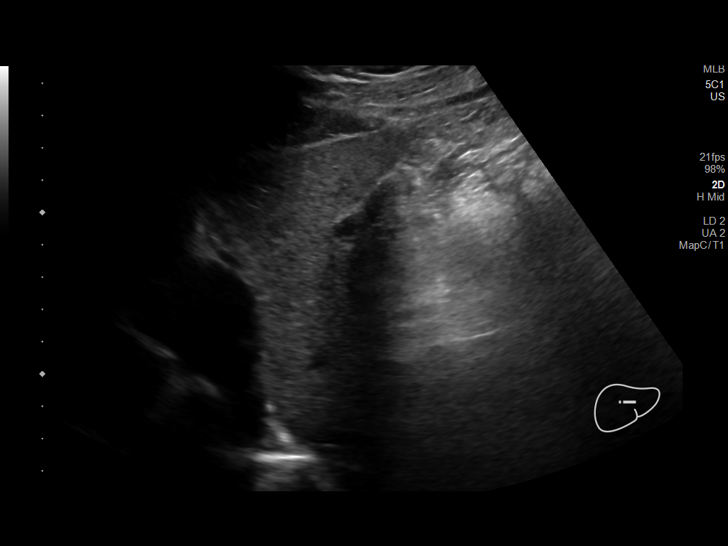
[im 27/36]
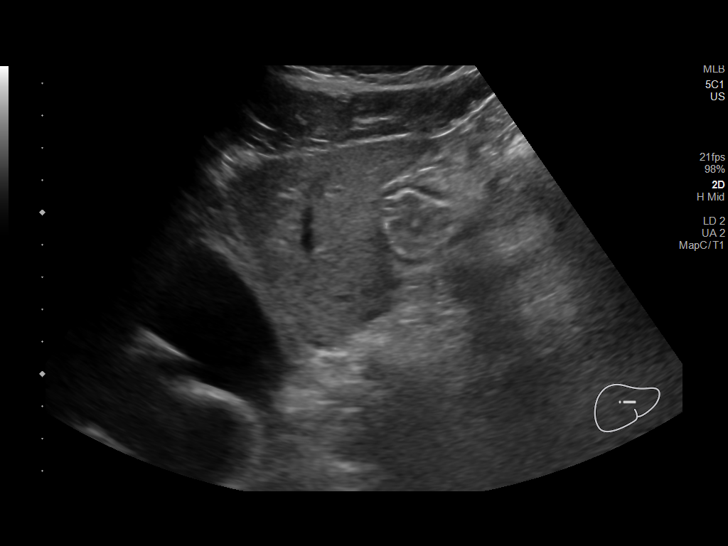
[im 30/36]
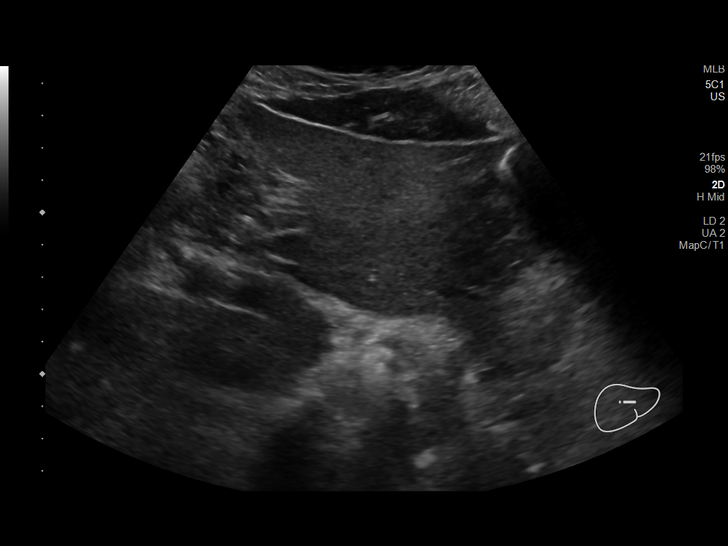
[im 33/36]
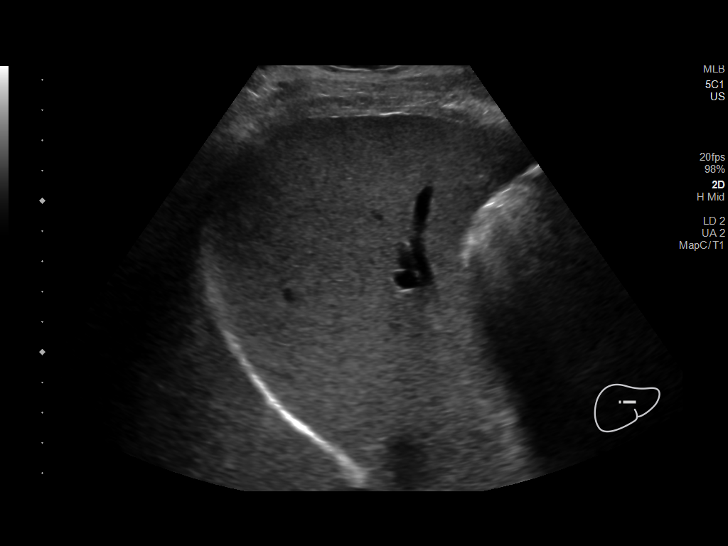
[im 36/36]
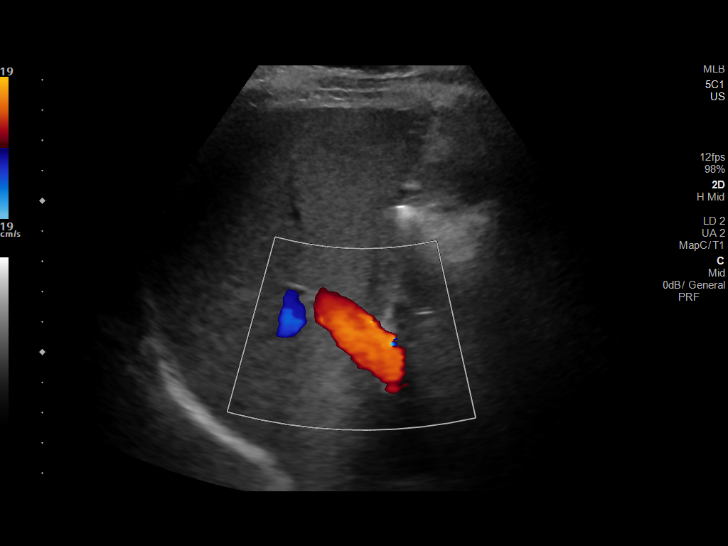

[14 of 25 positions shown; findings below may reference images not displayed]

FINDINGS: Gallbladder:

Gallbladder is contracted. No gallstones noted. Gallbladder wall
slightly thickened at 3.8 mm. Although cholecystitis cannot be
excluded mild gallbladder wall prominence may be secondary to
contracted state of gallbladder. No pericholecystic fluid
collection. Negative Murphy sign.

Common bile duct:

Diameter: 3.6 mm

Liver:

No focal lesion identified. Within normal limits in parenchymal
echogenicity. Portal vein is patent on color Doppler imaging with
normal direction of blood flow towards the liver.
IMPRESSION: Gallbladder is contracted. No gallstones noted. Gallbladder wall
slightly thickened at 3.8 mm. Although cholecystitis cannot be
completely excluded, mild gallbladder wall prominence may be
secondary to contracted state of gallbladder. No pericholecystic
fluid collection. Negative Murphy sign. No biliary distention.

## 2021-02-16 IMAGING — CT CT ABDOMEN AND PELVIS WITH CONTRAST
2 of 5 series · 16 of 46 positions shown, 18 images · IV contrast (omnipaque)
Comparison: Ultrasound of the abdomen on 12/31/2018, CT of the
abdomen and pelvis on 11/24/2015

CLINICAL DATA: Patient reports persistent mid/upper abdominal pain
for several days with emesis this morning , denies fever or diarrhea

EXAM:
CT ABDOMEN AND PELVIS WITH CONTRAST
TECHNIQUE: Multidetector CT imaging of the abdomen and pelvis was performed
using the standard protocol following bolus administration of
intravenous contrast.
CONTRAST:  100mL OMNIPAQUE IOHEXOL 300 MG/ML  SOLN

[Series 3: a/p w/ 5mm · axial · 0.70mm/px · z∈[-328,+77]mm · 13 of 91 slices shown, 15 images]
[im 5/91  soft-tissue]
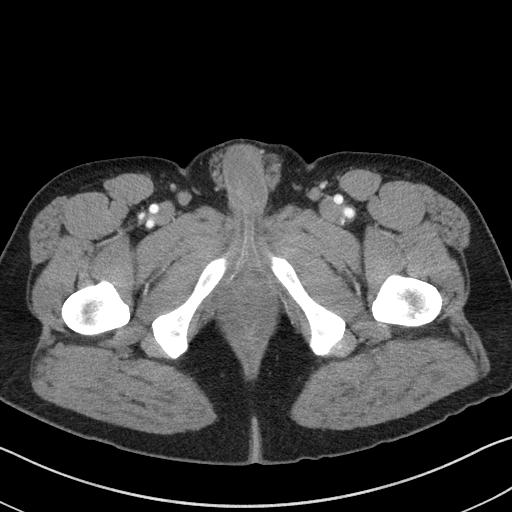
[im 5/91  bone]
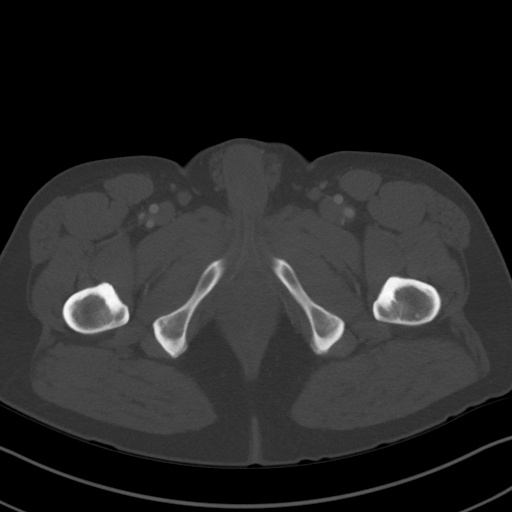
[im 14/91  soft-tissue]
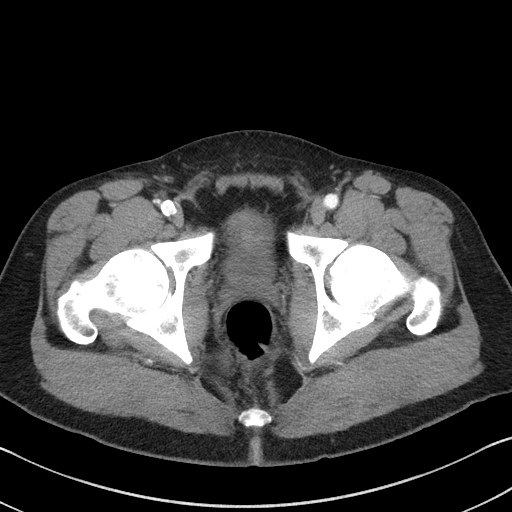
[im 19/91  soft-tissue]
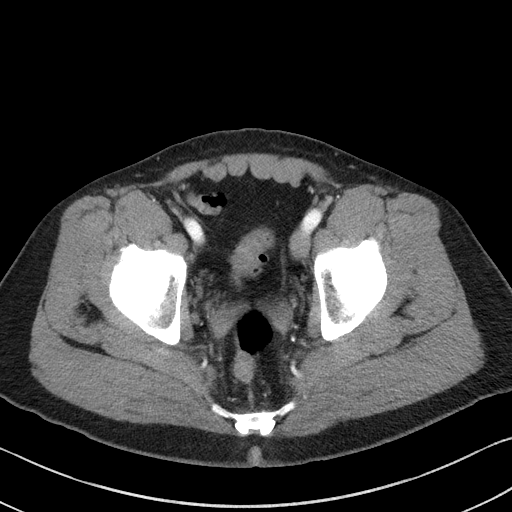
[im 28/91  soft-tissue]
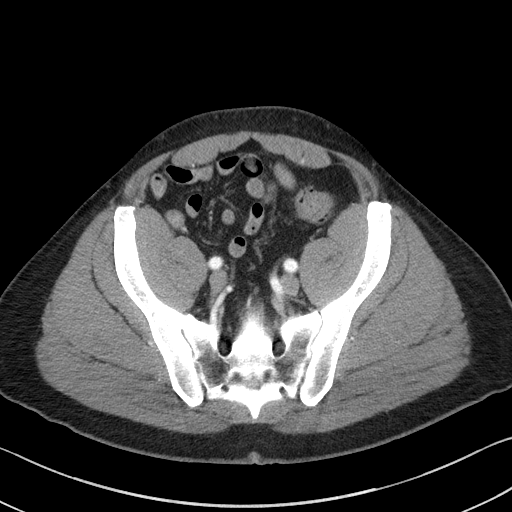
[im 32/91  soft-tissue]
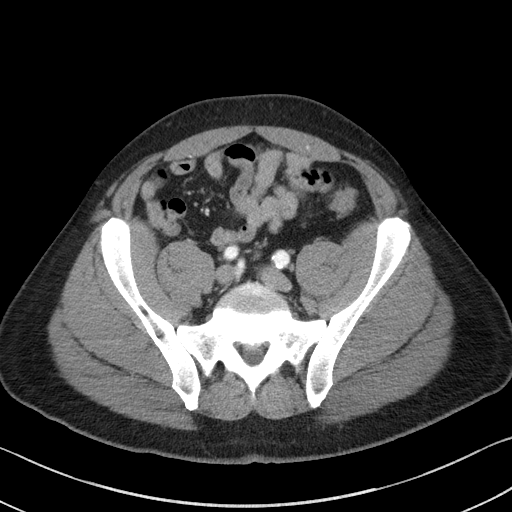
[im 41/91  soft-tissue]
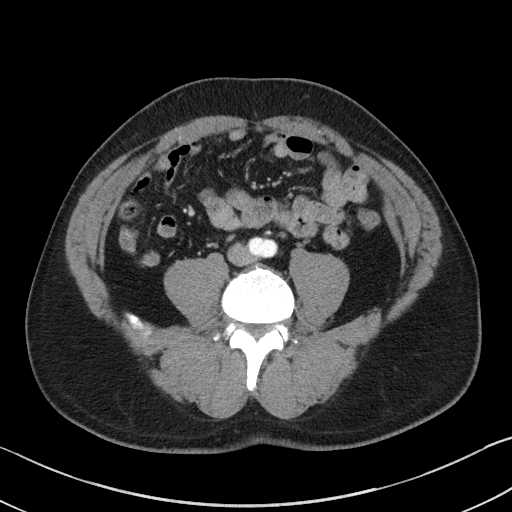
[im 46/91  soft-tissue]
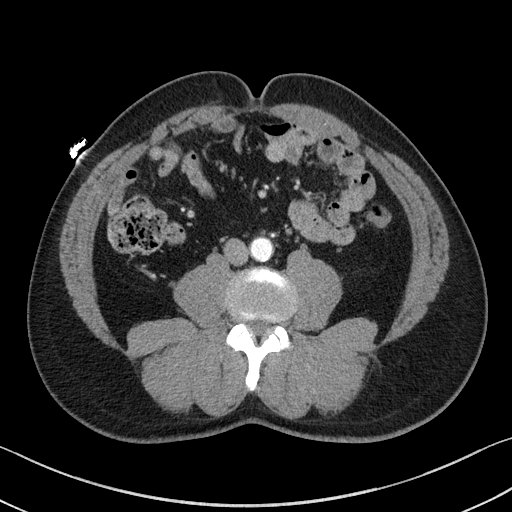
[im 50/91  soft-tissue]
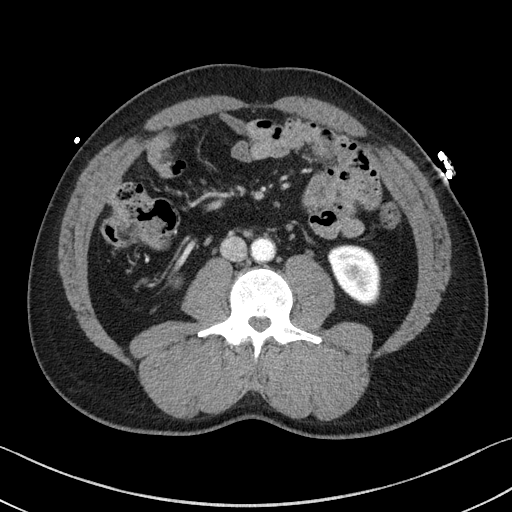
[im 59/91  soft-tissue]
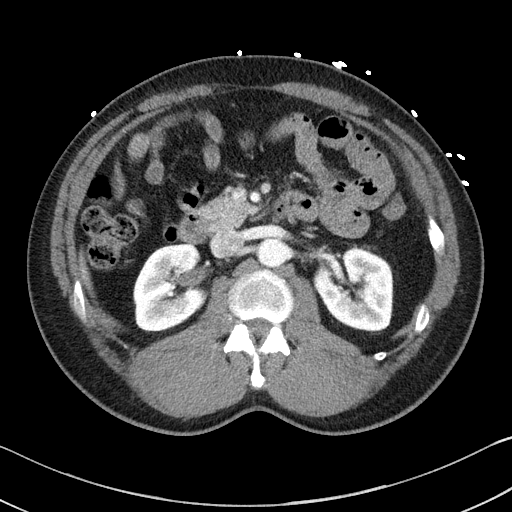
[im 59/91  bone]
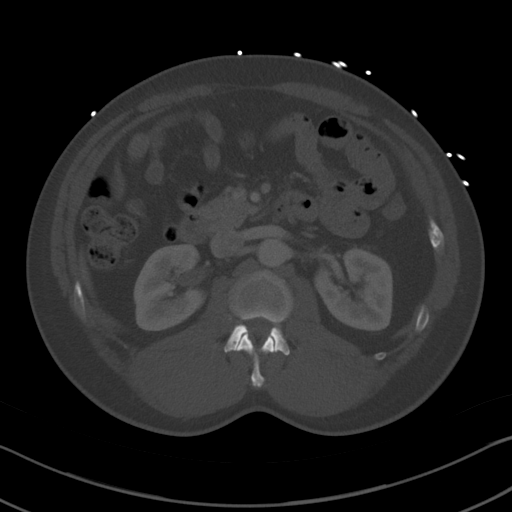
[im 64/91  soft-tissue]
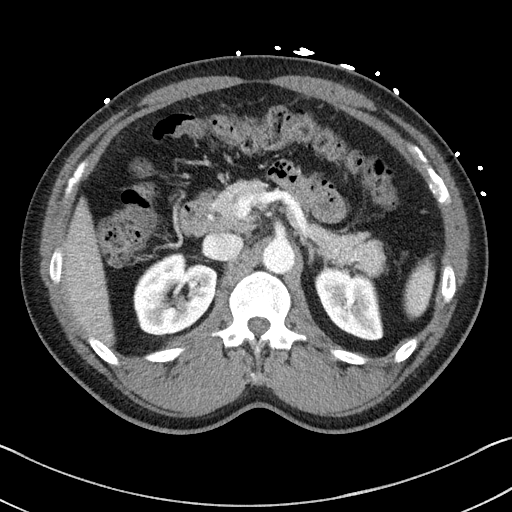
[im 73/91  soft-tissue]
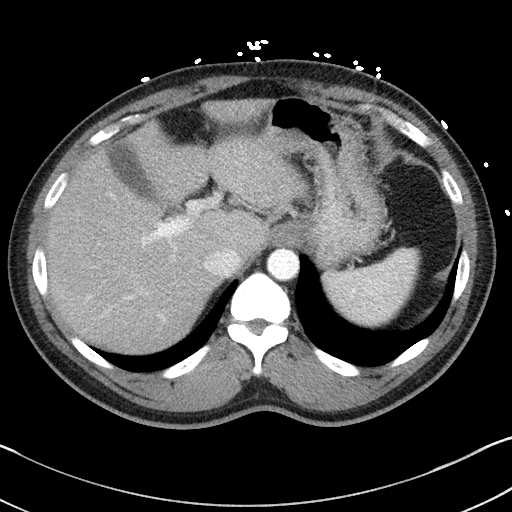
[im 77/91  soft-tissue]
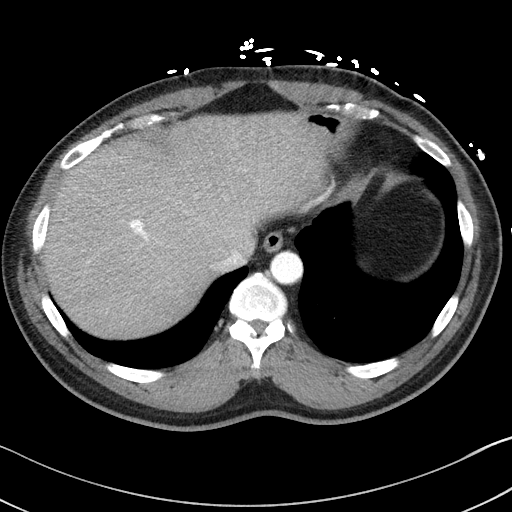
[im 86/91  soft-tissue]
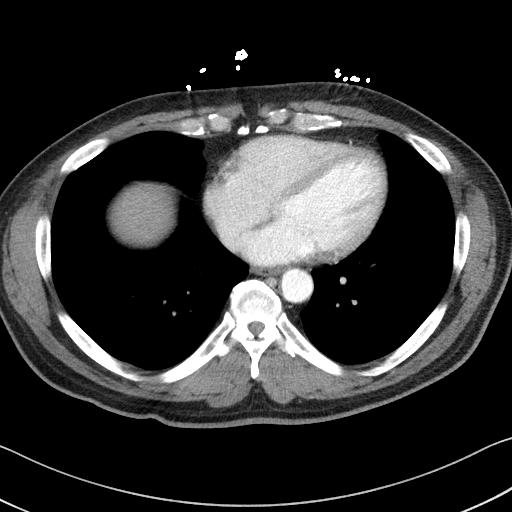

[Series 6: a/p w/ cor · coronal · 0.71mm/px · 3 of 146 slices shown]
[im 49/146  soft-tissue]
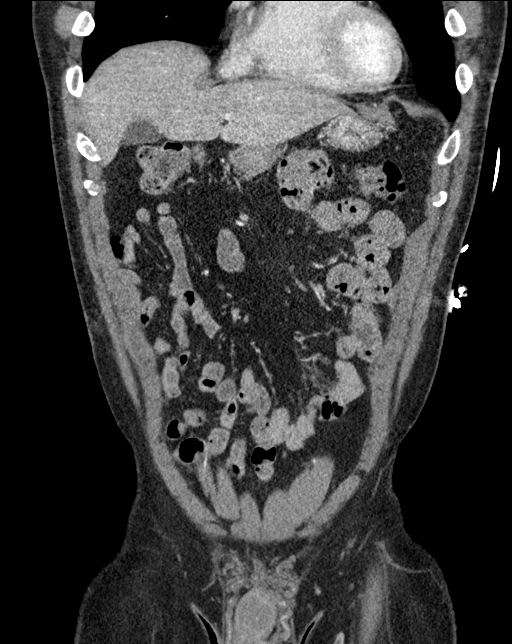
[im 65/146  soft-tissue]
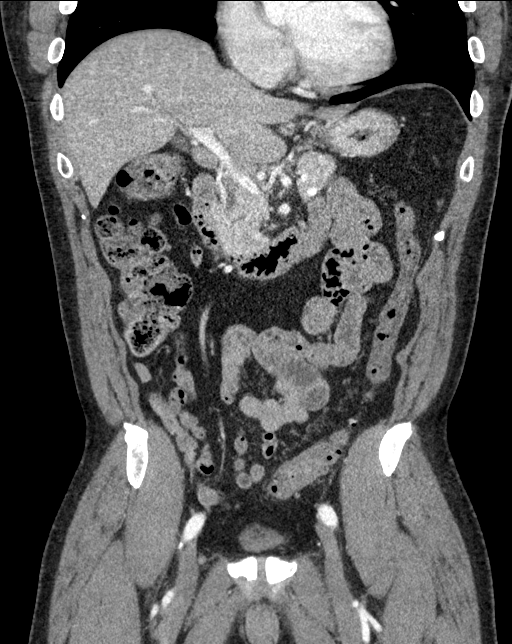
[im 81/146  soft-tissue]
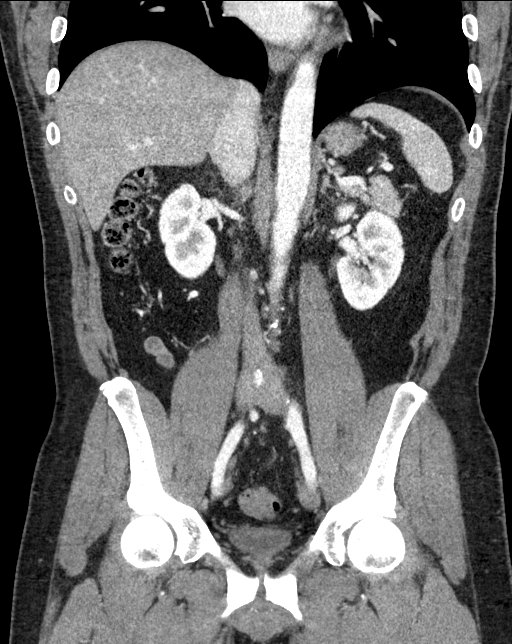

[16 of 46 positions shown; findings below may reference images not displayed]

FINDINGS: Lower chest: Lung bases are unremarkable.

Hepatobiliary: No focal liver abnormality is seen. No radiopaque
gallstones, biliary dilatation, or pericholecystic inflammatory
changes.

Pancreas: Unremarkable. No pancreatic ductal dilatation or
surrounding inflammatory changes.

Spleen: Normal in size without focal abnormality.

Adrenals/Urinary Tract: Adrenal glands are unremarkable. No
hydronephrosis or renal mass. The urinary bladder is unremarkable.

Stomach/Bowel: The stomach and small bowel loops are normal in
appearance. The appendix is surgically absent. There scattered
colonic diverticula, particularly within the sigmoid segment. No
associated inflammatory changes. No abscess or free intraperitoneal
air.

Vascular/Lymphatic: There is atherosclerotic calcification of the
abdominal aorta, not associated with aneurysm. No retroperitoneal or
mesenteric adenopathy.

Reproductive: Prostatic calcifications are present.

Other: No abdominal wall hernia or abnormality. No abdominopelvic
ascites.

Musculoskeletal: Mild degenerative changes in the LOWER lumbar
spine. Insert osseous
IMPRESSION: 1. Colonic diverticulosis without acute diverticulitis.
2. Status post appendectomy.
3. No bowel obstruction or abscess.
4. No urinary tract obstruction.

## 2021-09-23 ENCOUNTER — Encounter (HOSPITAL_COMMUNITY): Payer: Self-pay

## 2021-09-23 ENCOUNTER — Ambulatory Visit (HOSPITAL_COMMUNITY)
Admission: RE | Admit: 2021-09-23 | Discharge: 2021-09-23 | Disposition: A | Discharge status: Home / Self Care | Payer: BC Managed Care – PPO | Source: Ambulatory Visit | Attending: Family Medicine | Admitting: Family Medicine

## 2021-09-23 VITALS — BP 163/101 | HR 68 | Temp 98.6°F | Resp 16

## 2021-09-23 DIAGNOSIS — R519 Headache, unspecified: Secondary | ICD-10-CM

## 2021-09-23 DIAGNOSIS — M542 Cervicalgia: Secondary | ICD-10-CM

## 2021-09-23 DIAGNOSIS — I10 Essential (primary) hypertension: Secondary | ICD-10-CM

## 2021-09-23 MED ORDER — LISINOPRIL 10 MG PO TABS: 10.0000 mg | ORAL_TABLET | Freq: Every day | ORAL | 0 refills | Status: AC

## 2021-09-23 NOTE — ED Notes (Signed)
Pt BP 163/101 on L arm. 171/93 on R arm. ?

## 2021-09-23 NOTE — ED Triage Notes (Signed)
Pt presents for BP check. States his PCP asked for him to monitor BP at home.  ? ?Pt states he has been having headaches x 3 days. Denies dizziness.  ? ?Pt states he does not take medications for HBP. States he was just monitoring.  ? ?

## 2021-09-23 NOTE — ED Provider Notes (Signed)
?[facility] ? ? ? ?CSN: [id number] ?Arrival date & time: 09/23/21  2831 ? ? ?  ? ?History   ?Chief Complaint ?Chief Complaint  ?Patient presents with  ? Appointment  ? ? ?HPI ?Devon Knight is a 56 y.o. male.  ? ?Patient is here for bp check.  ?He had a cpe 3 months ago, bp was elevated then and asked to monitor his bp.  ?160/89.  ?About the last 4 days feeling "funny" with headaches, neck pain.  He checked it at home and was elevated 155/79.  ?He did change position at his job, more stressful.  ?Having pain at the back of the neck on the left, up to the head.  He can feel his heart beat, etc.  ?He is not currently on any bp medication.  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  ? ?Past Medical History:  ?Diagnosis Date  ? Kidney stones   ? Pre-diabetes   ? ? ?There are no problems to display for this patient. ? ? ?Past Surgical History:  ?Procedure Laterality Date  ? APPENDECTOMY    ? CHOLECYSTECTOMY    ? KNEE SURGERY    ? ? ? ? ? ?Home Medications   ? ?Prior to Admission medications   ?Medication Sig Start Date End Date Taking? Authorizing Provider  ?albuterol (VENTOLIN HFA) 108 (90 Base) MCG/ACT inhaler Inhale 2 puffs into the lungs every 6 (six) hours as needed for shortness of breath. 11/15/18   [provider]  ?famotidine (PEPCID) 20 MG tablet Take 1 tablet (20 mg total) by mouth 2 (two) times daily. 12/31/18   Pricilla Loveless, MD  ?ibuprofen (ADVIL,MOTRIN) 800 MG tablet Take 1 tablet (800 mg total) by mouth 3 (three) times daily. ?Patient not taking: Reported on 12/31/2018 11/24/15   Palumbo, April, MD  ?metFORMIN (GLUCOPHAGE-XR) 500 MG 24 hr tablet Take 500 mg by mouth daily. 11/29/18   [provider]  ?naproxen sodium (ALEVE) 220 MG tablet Take 220 mg by mouth 2 (two) times daily as needed (pain).    [provider]  ?ondansetron (ZOFRAN ODT) 8 MG disintegrating tablet 8mg  ODT q8 hours prn nausea ?Patient not taking: Reported on 12/31/2018 11/24/15    Palumbo, April, MD  ?oxyCODONE-acetaminophen (PERCOCET) 5-325 MG tablet Take 1 tablet by mouth every 6 (six) hours as needed. ?Patient not taking: Reported on 12/31/2018 11/24/15   Palumbo, April, MD  ?pantoprazole (PROTONIX) 40 MG tablet Take 1 tablet (40 mg total) by mouth daily. 12/31/18   03/03/19, MD  ?Pricilla Loveless 80 MCG/ACT inhaler Inhale 2 puffs into the lungs 2 (two) times daily as needed for shortness of breath. 11/15/18   [provider]  ?tamsulosin (FLOMAX) 0.4 MG CAPS capsule Take 1 capsule (0.4 mg total) by mouth daily. ?Patient not taking: Reported on 12/31/2018 11/24/15   11/26/15, April, MD  ? ? ?Family History ?History reviewed. No pertinent family history. ? ?Social History ?Social History  ? ?Tobacco Use  ? Smoking status: Never  ? Smokeless tobacco: Never  ?Substance Use Topics  ? Alcohol use: No  ? Drug use: No  ? ? ? ?Allergies   ?Iodine ? ? ?Review of Systems ?Review of Systems  ?Constitutional: Negative.   ?HENT: Negative.    ?Respiratory: Negative.    ?Cardiovascular: Negative.   ?Gastrointestinal: Negative.   ?Musculoskeletal:  Positive for neck pain.  ?Neurological:  Positive for headaches. Negative for dizziness, weakness and light-headedness.  ? ? ?  Physical Exam ?Triage Vital Signs ?ED Triage Vitals  ?Enc Vitals Group  ?   BP 09/23/21 0834 (!) 163/101  ?   Pulse Rate 09/23/21 0834 68  ?   Resp 09/23/21 0834 16  ?   Temp 09/23/21 0834 98.6 ?F (37 ?C)  ?   Temp Source 09/23/21 0834 Oral  ?   SpO2 09/23/21 0834 98 %  ?   Weight --   ?   Height --   ?   Head Circumference --   ?   Peak Flow --   ?   Pain Score 09/23/21 0833 0  ?   Pain Loc --   ?   Pain Edu? --   ?   Excl. in GC? --   ? ?No data found. ? ?Updated Vital Signs ?BP (!) 163/101 (BP Location: Left Arm)   Pulse 68   Temp 98.6 ?F (37 ?C) (Oral)   Resp 16   SpO2 98%  ?Repeat BP 154/96 ? ?Visual Acuity ?Right Eye Distance:   ?Left Eye Distance:   ?Bilateral Distance:   ? ?Right Eye Near:   ?Left Eye Near:    ?Bilateral  Near:    ? ?Physical Exam ?Constitutional:   ?   Appearance: Normal appearance.  ?HENT:  ?   Head: Normocephalic and atraumatic.  ?Cardiovascular:  ?   Rate and Rhythm: Normal rate and regular rhythm.  ?Pulmonary:  ?   Effort: Pulmonary effort is normal.  ?   Breath sounds: Normal breath sounds.  ?Musculoskeletal:     ?   General: No tenderness. Normal range of motion.  ?   Cervical back: Normal range of motion.  ?Neurological:  ?   General: No focal deficit present.  ?   Mental Status: He is alert and oriented to person, place, and time.  ?Psychiatric:     ?   Mood and Affect: Mood normal.     ?   Behavior: Behavior normal.  ? ? ? ?UC Treatments / Results  ?Labs ?(all labs ordered are listed, but only abnormal results are displayed) ?Labs Reviewed - No data to display ? ?EKG ? ? ?Radiology ?No results found. ? ?Procedures ?Procedures (including critical care time) ? ?Medications Ordered in UC ?Medications - No data to display ? ?Initial Impression / Assessment and Plan / UC Course  ?I have reviewed the triage vital signs and the nursing notes. ? ?Pertinent labs & imaging results that were available during my care of the patient were reviewed by me and considered in my medical decision making (see chart for details). ? ?Patient was seen today for headaches and elevated blood pressure.  His bp is elevated today, although not concerning per se.  He has had several readings of elevated bp on various days.  As a result will start lisinopril daily.  Discussed side affects.  He is to monitor his bp while on the medication and follow up with his pcp in the next month.  ?I think his headache could be stress/tension related to starting a new job.  I advised tylenol for pain, in addition to heating pad/ice pack.  He should go to the ER if he has worsening pain, n/v, or dizziness or any other concerning symptoms.  ?  ?Final Clinical Impressions(s) / UC Diagnoses  ? ?Final diagnoses:  ?Essential hypertension  ?Neck pain   ?Nonintractable headache, unspecified chronicity pattern, unspecified headache type  ? ? ? ?Discharge Instructions   ? ?  ?You were  seen today for elevated blood pressure along with headache and neck pain. You blood pressure is elevated today, but not worrisome.  I have sent out a blood pressure medication for you to take daily.  Please monitor your blood pressure and follow up with your primary care provider for further discussion and refills.  Please avoid salt and avoid taking motrin/advil for pain.  ?For your headache/neck pain you may take tylenol and use a heating pad/ice pack.   ?If your headache worsens, or you develop nausea, vomiting or dizziness then please go to the ER for further evaluation.  ? ? ? ?ED Prescriptions   ? ? Medication Sig Dispense Auth. Provider  ? lisinopril (ZESTRIL) 10 MG tablet Take 1 tablet (10 mg total) by mouth daily. 30 tablet Jannifer FranklinPiontek, Jejuan Scala, MD  ? ?  ? ?PDMP not reviewed this encounter. ?  Jannifer Franklin?Milferd Ansell, MD ?09/23/21 [phone] ? ?

## 2021-09-23 NOTE — Discharge Instructions (Addendum)
You were seen today for elevated blood pressure along with headache and neck pain. You blood pressure is elevated today, but not worrisome.  I have sent out a blood pressure medication for you to take daily.  Please monitor your blood pressure and follow up with your primary care provider for further discussion and refills.  Please avoid salt and avoid taking motrin/advil for pain.  ?For your headache/neck pain you may take tylenol and use a heating pad/ice pack.   ?If your headache worsens, or you develop nausea, vomiting or dizziness then please go to the ER for further evaluation.  ?

## 2021-10-27 ENCOUNTER — Ambulatory Visit: Payer: BC Managed Care – PPO | Admitting: Family Medicine
